# Patient Record
Sex: Female | Born: 1975 | Smoking: Never smoker
Health system: Southern US, Academic
[De-identification: ages and names within clinical notes are randomized; demographics above are authoritative.]

## PROBLEM LIST (undated history)

## (undated) DIAGNOSIS — D259 Leiomyoma of uterus, unspecified: Secondary | ICD-10-CM

## (undated) DIAGNOSIS — F99 Mental disorder, not otherwise specified: Secondary | ICD-10-CM

## (undated) DIAGNOSIS — N809 Endometriosis, unspecified: Secondary | ICD-10-CM

## (undated) HISTORY — PX: HX TUBAL LIGATION: SHX77

## (undated) HISTORY — PX: HX TONSILLECTOMY: SHX27

## (undated) HISTORY — DX: Leiomyoma of uterus, unspecified: D25.9

## (undated) HISTORY — PX: LAPAROSCOPIC ENDOMETRIOSIS FULGURATION: SUR769

## (undated) HISTORY — DX: Mental disorder, not otherwise specified: F99

## (undated) HISTORY — PX: TUBAL LIGATION: SHX77

## (undated) HISTORY — PX: TONSILLECTOMY: SUR1361

---

## 2005-01-09 ENCOUNTER — Emergency Department: Payer: Self-pay | Admitting: Unknown Physician Specialty

## 2006-01-25 ENCOUNTER — Ambulatory Visit: Payer: Self-pay | Admitting: Unknown Physician Specialty

## 2006-10-09 ENCOUNTER — Emergency Department: Payer: Self-pay | Admitting: Unknown Physician Specialty

## 2007-04-16 ENCOUNTER — Encounter: Payer: Self-pay | Admitting: Maternal and Fetal Medicine

## 2007-04-29 ENCOUNTER — Observation Stay: Payer: Self-pay | Admitting: Obstetrics and Gynecology

## 2007-05-12 ENCOUNTER — Observation Stay: Payer: Self-pay | Admitting: Obstetrics and Gynecology

## 2007-05-13 ENCOUNTER — Ambulatory Visit: Payer: Self-pay | Admitting: Obstetrics and Gynecology

## 2007-05-14 ENCOUNTER — Inpatient Hospital Stay: Payer: Self-pay | Admitting: Obstetrics and Gynecology

## 2008-06-08 ENCOUNTER — Other Ambulatory Visit: Payer: Self-pay

## 2008-06-08 ENCOUNTER — Ambulatory Visit: Payer: Self-pay | Admitting: Orthopedic Surgery

## 2008-06-08 ENCOUNTER — Ambulatory Visit: Payer: Self-pay | Admitting: Cardiology

## 2008-06-09 ENCOUNTER — Ambulatory Visit: Payer: Self-pay | Admitting: Internal Medicine

## 2008-06-14 ENCOUNTER — Ambulatory Visit: Payer: Self-pay | Admitting: Internal Medicine

## 2008-12-01 ENCOUNTER — Emergency Department: Payer: Self-pay | Admitting: Emergency Medicine

## 2008-12-03 ENCOUNTER — Emergency Department: Payer: Self-pay | Admitting: Emergency Medicine

## 2009-05-09 ENCOUNTER — Emergency Department: Payer: Self-pay | Admitting: Emergency Medicine

## 2009-05-11 ENCOUNTER — Emergency Department: Payer: Self-pay | Admitting: Emergency Medicine

## 2009-07-02 ENCOUNTER — Emergency Department: Payer: Self-pay | Admitting: Emergency Medicine

## 2009-08-28 ENCOUNTER — Emergency Department: Payer: Self-pay | Admitting: Emergency Medicine

## 2010-05-09 ENCOUNTER — Emergency Department (HOSPITAL_COMMUNITY): Admission: EM | Admit: 2010-05-09 | Discharge: 2010-05-09 | Payer: Self-pay | Admitting: Emergency Medicine

## 2010-09-27 ENCOUNTER — Emergency Department: Payer: Self-pay | Admitting: Emergency Medicine

## 2011-01-07 ENCOUNTER — Emergency Department: Payer: Self-pay | Admitting: Internal Medicine

## 2011-01-27 LAB — COMPREHENSIVE METABOLIC PANEL
Alkaline Phosphatase: 60 U/L (ref 39–117)
BUN: 11 mg/dL (ref 6–23)
CO2: 29 mEq/L (ref 19–32)
Calcium: 9.2 mg/dL (ref 8.4–10.5)
GFR calc non Af Amer: >60 mL/min (ref >60)
Glucose, Bld: 83 mg/dL (ref 70–99)
Potassium: 3.9 mEq/L (ref 3.5–5.1)
Total Protein: 7 g/dL (ref 6.0–8.3)

## 2011-01-27 LAB — URINE CULTURE

## 2011-01-27 LAB — DIFFERENTIAL
Basophils Relative: 0 % (ref 0–1)
Eosinophils Absolute: 0.1 10*3/uL (ref 0.0–0.7)
Monocytes Relative: 5 % (ref 3–12)
Neutro Abs: 4.7 10*3/uL (ref 1.7–7.7)
Neutrophils Relative %: 60 % (ref 43–77)

## 2011-01-27 LAB — CBC
HCT: 39.2 % (ref 36.0–46.0)
Hemoglobin: 13.5 g/dL (ref 12.0–15.0)
MCHC: 34.5 g/dL (ref 30.0–36.0)
MCV: 91.2 fL (ref 78.0–100.0)
RDW: 13.9 % (ref 11.5–15.5)

## 2011-01-27 LAB — URINALYSIS, ROUTINE W REFLEX MICROSCOPIC
Nitrite: NEGATIVE
Specific Gravity, Urine: 1.02 (ref 1.005–1.030)
Urobilinogen, UA: 1 mg/dL (ref 0.0–1.0)

## 2011-01-27 LAB — PREGNANCY, URINE: Preg Test, Ur: NEGATIVE

## 2011-03-12 ENCOUNTER — Emergency Department (HOSPITAL_COMMUNITY)
Admission: EM | Admit: 2011-03-12 | Discharge: 2011-03-12 | Disposition: A | Discharge status: Home / Self Care | Payer: Self-pay | Attending: Emergency Medicine | Admitting: Emergency Medicine

## 2011-03-12 DIAGNOSIS — W268XXA Contact with other sharp object(s), not elsewhere classified, initial encounter: Secondary | ICD-10-CM | POA: Insufficient documentation

## 2011-03-12 DIAGNOSIS — Y92009 Unspecified place in unspecified non-institutional (private) residence as the place of occurrence of the external cause: Secondary | ICD-10-CM | POA: Insufficient documentation

## 2011-03-12 DIAGNOSIS — S61209A Unspecified open wound of unspecified finger without damage to nail, initial encounter: Secondary | ICD-10-CM | POA: Insufficient documentation

## 2011-12-09 ENCOUNTER — Emergency Department: Payer: Self-pay | Admitting: Emergency Medicine

## 2014-02-22 ENCOUNTER — Emergency Department: Payer: Self-pay | Admitting: Emergency Medicine

## 2014-04-29 ENCOUNTER — Emergency Department (HOSPITAL_COMMUNITY): Payer: Self-pay

## 2014-04-29 ENCOUNTER — Emergency Department (HOSPITAL_COMMUNITY)
Admission: EM | Admit: 2014-04-29 | Discharge: 2014-04-29 | Disposition: A | Discharge status: Home / Self Care | Payer: Self-pay | Attending: Emergency Medicine | Admitting: Emergency Medicine

## 2014-04-29 ENCOUNTER — Encounter (HOSPITAL_COMMUNITY): Payer: Self-pay | Admitting: Emergency Medicine

## 2014-04-29 DIAGNOSIS — Z3202 Encounter for pregnancy test, result negative: Secondary | ICD-10-CM | POA: Insufficient documentation

## 2014-04-29 DIAGNOSIS — Z9851 Tubal ligation status: Secondary | ICD-10-CM | POA: Insufficient documentation

## 2014-04-29 DIAGNOSIS — Z8742 Personal history of other diseases of the female genital tract: Secondary | ICD-10-CM | POA: Insufficient documentation

## 2014-04-29 DIAGNOSIS — Z87448 Personal history of other diseases of urinary system: Secondary | ICD-10-CM | POA: Insufficient documentation

## 2014-04-29 DIAGNOSIS — R112 Nausea with vomiting, unspecified: Secondary | ICD-10-CM | POA: Insufficient documentation

## 2014-04-29 DIAGNOSIS — R1011 Right upper quadrant pain: Secondary | ICD-10-CM | POA: Insufficient documentation

## 2014-04-29 DIAGNOSIS — R000 Tachycardia, unspecified: Secondary | ICD-10-CM | POA: Insufficient documentation

## 2014-04-29 DIAGNOSIS — R1013 Epigastric pain: Secondary | ICD-10-CM | POA: Insufficient documentation

## 2014-04-29 DIAGNOSIS — R52 Pain, unspecified: Secondary | ICD-10-CM | POA: Insufficient documentation

## 2014-04-29 HISTORY — DX: Endometriosis, unspecified: N80.9

## 2014-04-29 LAB — COMPREHENSIVE METABOLIC PANEL
ALBUMIN: 3.9 g/dL (ref 3.5–5.2)
ALT: 16 U/L (ref 0–35)
AST: 16 U/L (ref 0–37)
Alkaline Phosphatase: 61 U/L (ref 39–117)
BUN: 13 mg/dL (ref 6–23)
CALCIUM: 9.1 mg/dL (ref 8.4–10.5)
CO2: 30 mEq/L (ref 19–32)
CREATININE: 0.89 mg/dL (ref 0.50–1.10)
Chloride: 102 mEq/L (ref 96–112)
GFR calc Af Amer: >90 mL/min (ref >90)
GFR calc non Af Amer: 81 mL/min — ABNORMAL LOW (ref >90)
Glucose, Bld: 79 mg/dL (ref 70–99)
Potassium: 3.8 mEq/L (ref 3.7–5.3)
Sodium: 143 mEq/L (ref 137–147)
Total Bilirubin: 0.4 mg/dL (ref 0.3–1.2)
Total Protein: 7 g/dL (ref 6.0–8.3)

## 2014-04-29 LAB — CBC WITH DIFFERENTIAL/PLATELET
BASOS ABS: 0 10*3/uL (ref 0.0–0.1)
BASOS PCT: 0 % (ref 0–1)
EOS PCT: 1 % (ref 0–5)
Eosinophils Absolute: 0.1 10*3/uL (ref 0.0–0.7)
HEMATOCRIT: 37.8 % (ref 36.0–46.0)
Hemoglobin: 13.1 g/dL (ref 12.0–15.0)
Lymphocytes Relative: 31 % (ref 12–46)
Lymphs Abs: 2.3 10*3/uL (ref 0.7–4.0)
MCH: 30.8 pg (ref 26.0–34.0)
MCHC: 34.7 g/dL (ref 30.0–36.0)
MCV: 88.7 fL (ref 78.0–100.0)
MONO ABS: 0.6 10*3/uL (ref 0.1–1.0)
Monocytes Relative: 8 % (ref 3–12)
Neutro Abs: 4.5 10*3/uL (ref 1.7–7.7)
Neutrophils Relative %: 60 % (ref 43–77)
Platelets: 250 10*3/uL (ref 150–400)
RBC: 4.26 MIL/uL (ref 3.87–5.11)
RDW: 12.6 % (ref 11.5–15.5)
WBC: 7.5 10*3/uL (ref 4.0–10.5)

## 2014-04-29 LAB — URINALYSIS, ROUTINE W REFLEX MICROSCOPIC
Bilirubin Urine: NEGATIVE
GLUCOSE, UA: NEGATIVE mg/dL
Hgb urine dipstick: NEGATIVE
Ketones, ur: NEGATIVE mg/dL
LEUKOCYTES UA: NEGATIVE
Nitrite: NEGATIVE
PH: 7 (ref 5.0–8.0)
PROTEIN: NEGATIVE mg/dL
Specific Gravity, Urine: 1.015 (ref 1.005–1.030)
Urobilinogen, UA: 0.2 mg/dL (ref 0.0–1.0)

## 2014-04-29 LAB — POC URINE PREG, ED: PREG TEST UR: NEGATIVE

## 2014-04-29 LAB — LIPASE, BLOOD: Lipase: 23 U/L (ref 11–59)

## 2014-04-29 MED ORDER — HYDROMORPHONE HCL PF 1 MG/ML IJ SOLN
1.0000 mg | Freq: Once | INTRAMUSCULAR | Status: DC
  Filled 2014-04-29: qty 1

## 2014-04-29 MED ORDER — SODIUM CHLORIDE 0.9 % IV SOLN
INTRAVENOUS | Status: DC
  Administered 2014-04-29: 1000 mL via INTRAVENOUS

## 2014-04-29 MED ORDER — ONDANSETRON HCL 4 MG/2ML IJ SOLN
4.0000 mg | Freq: Once | INTRAMUSCULAR | Status: DC
  Filled 2014-04-29: qty 2

## 2014-04-29 MED ORDER — PROMETHAZINE HCL 25 MG/ML IJ SOLN
12.5000 mg | Freq: Once | INTRAMUSCULAR | Status: AC
  Administered 2014-04-29: 12.5 mg via INTRAVENOUS
  Filled 2014-04-29: qty 1

## 2014-04-29 MED ORDER — FAMOTIDINE 20 MG PO TABS: 20.0000 mg | ORAL_TABLET | Freq: Two times a day (BID) | ORAL | Status: DC

## 2014-04-29 MED ORDER — HYDROCODONE-ACETAMINOPHEN 5-325 MG PO TABS: 1.0000 | ORAL_TABLET | ORAL | Status: DC | PRN

## 2014-04-29 MED ORDER — HYDROMORPHONE HCL PF 1 MG/ML IJ SOLN
1.0000 mg | Freq: Once | INTRAMUSCULAR | Status: AC
  Administered 2014-04-29: 1 mg via INTRAVENOUS

## 2014-04-29 MED ORDER — HYDROMORPHONE HCL PF 1 MG/ML IJ SOLN
1.0000 mg | Freq: Once | INTRAMUSCULAR | Status: AC
  Administered 2014-04-29: 1 mg via INTRAVENOUS
  Filled 2014-04-29: qty 1

## 2014-04-29 MED ORDER — ONDANSETRON HCL 4 MG/2ML IJ SOLN
4.0000 mg | Freq: Once | INTRAMUSCULAR | Status: DC
Start: 2014-04-29 — End: 2014-04-29
  Filled 2014-04-29: qty 2

## 2014-04-29 MED ORDER — PROMETHAZINE HCL 25 MG PO TABS: 25.0000 mg | ORAL_TABLET | Freq: Four times a day (QID) | ORAL | Status: DC | PRN

## 2014-04-29 MED ORDER — FAMOTIDINE IN NACL 20-0.9 MG/50ML-% IV SOLN
20.0000 mg | Freq: Once | INTRAVENOUS | Status: AC
  Administered 2014-04-29: 20 mg via INTRAVENOUS
  Filled 2014-04-29: qty 50

## 2014-04-29 MED ORDER — ONDANSETRON HCL 4 MG/2ML IJ SOLN
4.0000 mg | Freq: Once | INTRAMUSCULAR | Status: AC
  Administered 2014-04-29: 4 mg via INTRAVENOUS

## 2014-04-29 NOTE — Discharge Instructions (Signed)
Your ultrasound today shows that you have sludge in your gall bladder. This may be causing the pain you are having in the upper right side of your abdomen. We are treating you with pain medication, medication for nausea and a gastric acid blocker. However, you will need to follow up with the surgeon. Make sure your diet is low in fat and spicy foods to try and prevent more attacks.

## 2014-04-29 NOTE — ED Provider Notes (Signed)
CSN: [id number]     Arrival date & time 04/29/14  1136 History   First MD Initiated Contact with Patient 04/29/14 1147     Chief Complaint  Patient presents with  . Abdominal Pain     (Consider location/radiation/quality/duration/timing/severity/associated sxs/prior Treatment) Patient is a 37 y.o. female presenting with abdominal pain. The history is provided by the patient.  Abdominal Pain Pain location:  RUQ Pain quality: bloating, burning, fullness and squeezing   Pain radiates to:  Does not radiate Pain severity:  Severe Progression:  Worsening Relieved by:  Nothing Worsened by:  Eating Ineffective treatments:  None tried Associated symptoms: nausea and vomiting    Summer Bates is a 38 y.o. female who presents to the ED with epigastric and RUQ abdominal pain with nausea and vomiting that is severe today. She states that over the past several months she has had similar episodes but they usually resolve. This time the pain and n/v has continues.   Past Medical History  Diagnosis Date  . Endometriosis    Past Surgical History  Procedure Laterality Date  . Tonsillectomy    . Tubal ligation     No family history on file. History  Substance Use Topics  . Smoking status: Never Smoker   . Smokeless tobacco: Not on file  . Alcohol Use: No   OB History   Grav Para Term Preterm Abortions TAB SAB Ect Mult Living                 Review of Systems  Gastrointestinal: Positive for nausea, vomiting and abdominal pain.  All other systems negative    Allergies  Review of patient's allergies indicates no known allergies.  Home Medications   Prior to Admission medications   Not on File   BP 129/80  Pulse 113  Temp(Src) 97.9 F (36.6 C) (Oral)  Resp 20  Ht 5\' 5"  (1.651 m)  Wt 180 lb (81.647 kg)  BMI 29.95 kg/m2  SpO2 98%  LMP 04/20/2014 Physical Exam  Nursing note and vitals reviewed. Constitutional: She is oriented to person, place, and time. She appears  well-developed and well-nourished. No distress.  HENT:  Head: Normocephalic.  Eyes: EOM are normal.  Neck: Neck supple.  Cardiovascular: Tachycardia present.   Pulmonary/Chest: Effort normal.  Abdominal: Soft. Bowel sounds are normal. There is tenderness in the right upper quadrant and epigastric area. There is no rigidity, no rebound and no CVA tenderness.  Musculoskeletal: Normal range of motion.  Neurological: She is alert and oriented to person, place, and time. No cranial nerve deficit.  Skin: Skin is warm and dry.  Psychiatric: She has a normal mood and affect. Her behavior is normal.    ED Course: Dr. Lacinda Axon in to examine the patient and discuss ultrasound results.  Procedures  IV NS, Zofran, Dilaudid 1 mg IV, Pepcid 20 mg IV, Phenergan 12.5 mg IV, Dilaudid 1 mg IV repeated  Labs, [place] Results for orders placed during the hospital encounter of 04/29/14 (from the past 24 hour(s))  CBC WITH DIFFERENTIAL     Status: None   Collection Time    04/29/14 12:09 PM      Result Value Ref Range   WBC 7.5  4.0 - 10.5 K/uL   RBC 4.26  3.87 - 5.11 MIL/uL   Hemoglobin 13.1  12.0 - 15.0 g/dL   HCT 37.8  36.0 - 46.0 %   MCV 88.7  78.0 - 100.0 fL   MCH 30.8  26.0 - 34.0  pg   MCHC 34.7  30.0 - 36.0 g/dL   RDW 12.6  11.5 - 15.5 %   Platelets 250  150 - 400 K/uL   Neutrophils Relative % 60  43 - 77 %   Neutro Abs 4.5  1.7 - 7.7 K/uL   Lymphocytes Relative 31  12 - 46 %   Lymphs Abs 2.3  0.7 - 4.0 K/uL   Monocytes Relative 8  3 - 12 %   Monocytes Absolute 0.6  0.1 - 1.0 K/uL   Eosinophils Relative 1  0 - 5 %   Eosinophils Absolute 0.1  0.0 - 0.7 K/uL   Basophils Relative 0  0 - 1 %   Basophils Absolute 0.0  0.0 - 0.1 K/uL  COMPREHENSIVE METABOLIC PANEL     Status: Abnormal   Collection Time    04/29/14 12:09 PM      Result Value Ref Range   Sodium 143  137 - 147 mEq/L   Potassium 3.8  3.7 - 5.3 mEq/L   Chloride 102  96 - 112 mEq/L   CO2 30  19 - 32 mEq/L   Glucose, Bld 79  70 - 99  mg/dL   BUN 13  6 - 23 mg/dL   Creatinine, Ser 0.89  0.50 - 1.10 mg/dL   Calcium 9.1  8.4 - 10.5 mg/dL   Total Protein 7.0  6.0 - 8.3 g/dL   Albumin 3.9  3.5 - 5.2 g/dL   AST 16  0 - 37 U/L   ALT 16  0 - 35 U/L   Alkaline Phosphatase 61  39 - 117 U/L   Total Bilirubin 0.4  0.3 - 1.2 mg/dL   GFR calc non Af Amer 81 (*) >90 mL/min   GFR calc Af Amer >90  >90 mL/min  LIPASE, BLOOD     Status: None   Collection Time    04/29/14 12:09 PM      Result Value Ref Range   Lipase 23  11 - 59 U/L     Imaging Review US Abdomen Limited  04/29/2014   CLINICAL DATA:  RUQ pain, n/v  EXAM: US ABDOMEN LIMITED - RIGHT UPPER QUADRANT  COMPARISON:  None.  FINDINGS: Gallbladder:  No gallstones or wall thickening visualized, measuring 2.6 mm. Mildly echogenic sludge within the dependent portion gallbladder. Positive sonographic Murphy's sign. No pericholecystic fluid.  Common bile duct:  Diameter: 3.8 mm  Liver:  No focal lesion identified. Within normal limits in parenchymal echogenicity.  IMPRESSION: Positive sonographic Murphy's sign. No further sonographic evidence of cholecystitis. Small amount of dependent sludge within the gallbladder. Clinical correlation recommended.   Electronically Signed   By: Margaree Mackintosh M.D.   On: 04/29/2014 13:46   MDM  38 y.o. female with epigastric and RUQ pain, n/v. Stable for discharge with gall stones or blockage of the bile duct. VS stable BP 120/70  Pulse 85  Temp(Src) 97.9 F (36.6 C) (Oral)  Resp 20  Ht 5\' 5"  (1.651 m)  Wt 180 lb (81.647 kg)  BMI 29.95 kg/m2  SpO2 98%  LMP 04/20/2014  I have reviewed this patient's vital signs, nurses notes, appropriate labs and imaging.  I have discussed finding with the patient and plan of care. She voices understanding. She will follow up with the general surgeon.    Medication List         famotidine 20 MG tablet  Commonly known as:  PEPCID  Take 1 tablet (20 mg total) by  mouth 2 (two) times daily.      HYDROcodone-acetaminophen 5-325 MG per tablet  Commonly known as:  NORCO/VICODIN  Take 1 tablet by mouth every 4 (four) hours as needed.     promethazine 25 MG tablet  Commonly known as:  PHENERGAN  Take 1 tablet (25 mg total) by mouth every 6 (six) hours as needed for nausea or vomiting.           [place], [place] 04/29/14 1704

## 2014-04-29 NOTE — ED Notes (Signed)
Pt reports frequent episodes of RUQ pain, pain in right side, and vomiting.

## 2014-04-29 NOTE — ED Notes (Signed)
Patient discharged but does not have ride home. Patient to waiting for med time for narcotic or until able to get ride home. NP and charge nurse aware.

## 2014-04-30 NOTE — ED Provider Notes (Signed)
Medical screening examination/treatment/procedure(s) were conducted as a shared visit with non-physician practitioner(s) and myself.  I personally evaluated the patient during the encounter.   EKG Interpretation None     Right upper quadrant pain with radiation to the back. Right upper quadrant ultrasound reveals gallbladder sludge but no frank stones. Referral to general surgery   Nat Christen, MD 04/30/14 [phone]

## 2014-05-03 ENCOUNTER — Emergency Department (HOSPITAL_COMMUNITY)
Admission: EM | Admit: 2014-05-03 | Discharge: 2014-05-03 | Disposition: A | Discharge status: Home / Self Care | Payer: Self-pay | Attending: Emergency Medicine | Admitting: Emergency Medicine

## 2014-05-03 ENCOUNTER — Encounter (HOSPITAL_COMMUNITY): Payer: Self-pay | Admitting: Emergency Medicine

## 2014-05-03 DIAGNOSIS — K8020 Calculus of gallbladder without cholecystitis without obstruction: Secondary | ICD-10-CM | POA: Insufficient documentation

## 2014-05-03 DIAGNOSIS — K8050 Calculus of bile duct without cholangitis or cholecystitis without obstruction: Secondary | ICD-10-CM

## 2014-05-03 DIAGNOSIS — Z9851 Tubal ligation status: Secondary | ICD-10-CM | POA: Insufficient documentation

## 2014-05-03 DIAGNOSIS — Z79899 Other long term (current) drug therapy: Secondary | ICD-10-CM | POA: Insufficient documentation

## 2014-05-03 DIAGNOSIS — Z8742 Personal history of other diseases of the female genital tract: Secondary | ICD-10-CM | POA: Insufficient documentation

## 2014-05-03 DIAGNOSIS — R630 Anorexia: Secondary | ICD-10-CM | POA: Insufficient documentation

## 2014-05-03 LAB — CBC WITH DIFFERENTIAL/PLATELET
Basophils Absolute: 0 10*3/uL (ref 0.0–0.1)
Basophils Relative: 0 % (ref 0–1)
Eosinophils Absolute: 0 10*3/uL (ref 0.0–0.7)
Eosinophils Relative: 0 % (ref 0–5)
HCT: 40.7 % (ref 36.0–46.0)
Hemoglobin: 14.2 g/dL (ref 12.0–15.0)
Lymphocytes Relative: 16 % (ref 12–46)
Lymphs Abs: 1.7 10*3/uL (ref 0.7–4.0)
MCH: 30.9 pg (ref 26.0–34.0)
MCHC: 34.9 g/dL (ref 30.0–36.0)
MCV: 88.7 fL (ref 78.0–100.0)
Monocytes Absolute: 0.5 10*3/uL (ref 0.1–1.0)
Monocytes Relative: 5 % (ref 3–12)
Neutro Abs: 8.3 10*3/uL — ABNORMAL HIGH (ref 1.7–7.7)
Neutrophils Relative %: 79 % — ABNORMAL HIGH (ref 43–77)
Platelets: 271 10*3/uL (ref 150–400)
RBC: 4.59 MIL/uL (ref 3.87–5.11)
RDW: 12.7 % (ref 11.5–15.5)
WBC: 10.5 10*3/uL (ref 4.0–10.5)

## 2014-05-03 LAB — COMPREHENSIVE METABOLIC PANEL WITH GFR
ALT: 22 U/L (ref 0–35)
AST: 20 U/L (ref 0–37)
Albumin: 4.1 g/dL (ref 3.5–5.2)
Alkaline Phosphatase: 79 U/L (ref 39–117)
BUN: 10 mg/dL (ref 6–23)
CO2: 25 meq/L (ref 19–32)
Calcium: 9.7 mg/dL (ref 8.4–10.5)
Chloride: 104 meq/L (ref 96–112)
Creatinine, Ser: 0.67 mg/dL (ref 0.50–1.10)
GFR calc Af Amer: >90 mL/min
GFR calc non Af Amer: >90 mL/min
Glucose, Bld: 97 mg/dL (ref 70–99)
Potassium: 4.3 meq/L (ref 3.7–5.3)
Sodium: 143 meq/L (ref 137–147)
Total Bilirubin: 0.2 mg/dL — ABNORMAL LOW (ref 0.3–1.2)
Total Protein: 8 g/dL (ref 6.0–8.3)

## 2014-05-03 LAB — LIPASE, BLOOD: Lipase: 20 U/L (ref 11–59)

## 2014-05-03 MED ORDER — HYDROMORPHONE HCL PF 1 MG/ML IJ SOLN
1.0000 mg | Freq: Once | INTRAMUSCULAR | Status: AC
  Administered 2014-05-03: 1 mg via INTRAVENOUS
  Filled 2014-05-03: qty 1

## 2014-05-03 MED ORDER — OXYCODONE-ACETAMINOPHEN 5-325 MG PO TABS: 1.0000 | ORAL_TABLET | ORAL | Status: DC | PRN

## 2014-05-03 MED ORDER — MORPHINE SULFATE 4 MG/ML IJ SOLN
4.0000 mg | Freq: Once | INTRAMUSCULAR | Status: AC
  Administered 2014-05-03: 4 mg via INTRAVENOUS
  Filled 2014-05-03: qty 1

## 2014-05-03 MED ORDER — LORAZEPAM 2 MG/ML IJ SOLN
1.0000 mg | Freq: Once | INTRAMUSCULAR | Status: AC
  Administered 2014-05-03: 1 mg via INTRAVENOUS
  Filled 2014-05-03: qty 1

## 2014-05-03 MED ORDER — ONDANSETRON HCL 4 MG/2ML IJ SOLN
4.0000 mg | Freq: Once | INTRAMUSCULAR | Status: AC
  Administered 2014-05-03: 4 mg via INTRAVENOUS
  Filled 2014-05-03: qty 2

## 2014-05-03 NOTE — Discharge Instructions (Signed)
Biliary Colic  °Biliary colic is a steady or irregular pain in the upper abdomen. It is usually under the right side of the rib cage. It happens when gallstones interfere with the normal flow of bile from the gallbladder. Bile is a liquid that helps to digest fats. Bile is made in the liver and stored in the gallbladder. When you eat a meal, bile passes from the gallbladder through the cystic duct and the common bile duct into the small intestine. There, it mixes with partially digested food. If a gallstone blocks either of these ducts, the normal flow of bile is blocked. The muscle cells in the bile duct contract forcefully to try to move the stone. This causes the pain of biliary colic.  °SYMPTOMS  °· A person with biliary colic usually complains of pain in the upper abdomen. This pain can be: °¨ In the center of the upper abdomen just below the breastbone. °¨ In the upper-right part of the abdomen, near the gallbladder and liver. °¨ Spread back toward the right shoulder blade. °· Nausea and vomiting. °· The pain usually occurs after eating. °· Biliary colic is usually triggered by the digestive system's demand for bile. The demand for bile is high after fatty meals. Symptoms can also occur when a person who has been fasting suddenly eats a very large meal. Most episodes of biliary colic pass after 1 to 5 hours. After the most intense pain passes, your abdomen may continue to ache mildly for about 24 hours. °DIAGNOSIS  °After you describe your symptoms, your caregiver will perform a physical exam. He or she will pay attention to the upper right portion of your belly (abdomen). This is the area of your liver and gallbladder. An ultrasound will help your caregiver look for gallstones. Specialized scans of the gallbladder may also be done. Blood tests may be done, especially if you have fever or if your pain persists. °PREVENTION  °Biliary colic can be prevented by controlling the risk factors for gallstones. Some of  these risk factors, such as heredity, increasing age, and pregnancy are a normal part of life. Obesity and a high-fat diet are risk factors you can change through a healthy lifestyle. Women going through menopause who take hormone replacement therapy (estrogen) are also more likely to develop biliary colic. °TREATMENT  °· Pain medication may be prescribed. °· You may be encouraged to eat a fat-free diet. °· If the first episode of biliary colic is severe, or episodes of colic keep retuning, surgery to remove the gallbladder (cholecystectomy) is usually recommended. This procedure can be done through small incisions using an instrument called a laparoscope. The procedure often requires a brief stay in the hospital. Some people can leave the hospital the same day. It is the most widely used treatment in people troubled by painful gallstones. It is effective and safe, with no complications in more than 90% of cases. °· If surgery cannot be done, medication that dissolves gallstones may be used. This medication is expensive and can take months or years to work. Only small stones will dissolve. °· Rarely, medication to dissolve gallstones is combined with a procedure called shock-wave lithotripsy. This procedure uses carefully aimed shock waves to break up gallstones. In many people treated with this procedure, gallstones form again within a few years. °PROGNOSIS  °If gallstones block your cystic duct or common bile duct, you are at risk for repeated episodes of biliary colic. There is also a 25% chance that you will develop   a gallbladder infection(acute cholecystitis), or some other complication of gallstones within 10 to 20 years. If you have surgery, schedule it at a time that is convenient for you and at a time when you are not sick. HOME CARE INSTRUCTIONS   Drink plenty of clear fluids.  Avoid fatty, greasy or fried foods, or any foods that make your pain worse.  Take medications as directed. SEEK MEDICAL  CARE IF:   You develop a fever over 100.5 F (38.1 C).  Your pain gets worse over time.  You develop nausea that prevents you from eating and drinking.  You develop vomiting. SEEK IMMEDIATE MEDICAL CARE IF:   You have continuous or severe belly (abdominal) pain which is not relieved with medications.  You develop nausea and vomiting which is not relieved with medications.  You have symptoms of biliary colic and you suddenly develop a fever and shaking chills. This may signal cholecystitis. Call your caregiver immediately.  You develop a yellow color to your skin or the white part of your eyes (jaundice). Document Released: 03/31/2006 Document Revised: 01/20/2012 Document Reviewed: 06/09/2008 [facility] Patient Information 2015 [place], [place]. This information is not intended to replace advice given to you by your health care provider. Make sure you discuss any questions you have with your health care provider.    As discussed call Dr. Arnoldo Morale office for an office appointment next Thursday.  Return here or call him for an appointment sooner for any worsened including uncontrolled vomiting, abdominal pain or fever develop fever.  You may use the oxycodone for pain control in place of your hydrocodone.  Use caution as this medication will make you sleepy.  Continue taking your Phenergan if needed for nausea or vomiting.  Also, as discussed, you need to contact the financial office here at [facility] to discuss your upcoming elective surgery so they can arrange for financial assistance for this.  Call the [facility] office for this - [phone] and asked for the financial office to get this taken care of.

## 2014-05-03 NOTE — ED Provider Notes (Signed)
CSN: [id number]     Arrival date & time 05/03/14  1110 History   First MD Initiated Contact with Patient 05/03/14 1136     Chief Complaint  Patient presents with  . Abdominal Pain     (Consider location/radiation/quality/duration/timing/severity/associated sxs/prior Treatment) HPI Comments: Summer Bates is a 38 y.o. Female presenting with return of her recently frequent right upper quadrant abdominal pain, nausea and vomiting which started again early this morning.  She was seen here for this 4 days ago at which time an abdominal US revealed gallbladder sludge without stones and no acute cholecystitis.  She was prescribed hydrocodone and phenergan which is not controlling her symptoms.  She denies fevers or chills, weakness or dizziness.  She endorses nearly a 10 pound weight loss since this started as she has a reduced appetite. Additionally denies diarrhea, constipation, lower abdominal pain, dysuria and vaginal discharge.   She expresses frustration over her frequent episodes of pain, stating she is having episodes every 2-3 days and is concerned she will lose her job due to absences.       The history is provided by the patient.    Past Medical History  Diagnosis Date  . Endometriosis    Past Surgical History  Procedure Laterality Date  . Tonsillectomy    . Tubal ligation     History reviewed. No pertinent family history. History  Substance Use Topics  . Smoking status: Never Smoker   . Smokeless tobacco: Not on file  . Alcohol Use: Yes   OB History   Grav Para Term Preterm Abortions TAB SAB Ect Mult Living                 Review of Systems  Constitutional: Positive for appetite change.  HENT: Negative for congestion.   Eyes: Negative.   Respiratory: Negative for chest tightness.   Gastrointestinal: Positive for nausea, vomiting and abdominal pain. Negative for diarrhea, constipation and abdominal distention.  Genitourinary: Negative.  Negative for dysuria.   Musculoskeletal: Negative for arthralgias, joint swelling and neck pain.  Skin: Negative.  Negative for rash and wound.  Neurological: Negative for dizziness, weakness, light-headedness, numbness and headaches.  Psychiatric/Behavioral: Negative.       Allergies  Review of patient's allergies indicates no known allergies.  Home Medications   Prior to Admission medications   Medication Sig Start Date End Date Taking? Authorizing Provider  famotidine (PEPCID) 20 MG tablet Take 1 tablet (20 mg total) by mouth 2 (two) times daily. 04/29/14  Yes [place], NP  HYDROcodone-acetaminophen (NORCO/VICODIN) 5-325 MG per tablet Take 1 tablet by mouth every 4 (four) hours as needed. 04/29/14  Yes Hope Bunnie Pion, NP  promethazine (PHENERGAN) 25 MG tablet Take 1 tablet (25 mg total) by mouth every 6 (six) hours as needed for nausea or vomiting. 04/29/14  Yes Hope Bunnie Pion, NP  oxyCODONE-acetaminophen (PERCOCET/ROXICET) 5-325 MG per tablet Take 1 tablet by mouth every 4 (four) hours as needed for severe pain. 05/03/14   Evalee Jefferson, PA-C   BP 109/82  Pulse 92  Temp(Src) 97.9 F (36.6 C) (Oral)  Resp 18  Ht 5\' 5"  (1.651 m)  Wt 180 lb (81.647 kg)  BMI 29.95 kg/m2  SpO2 94%  LMP 04/20/2014 Physical Exam  Nursing note and vitals reviewed. Constitutional: She appears well-developed and well-nourished.  HENT:  Head: Normocephalic and atraumatic.  Eyes: Conjunctivae are normal. No scleral icterus.  Neck: Normal range of motion.  Cardiovascular: Normal rate, regular rhythm, normal heart  sounds and intact distal pulses.   Pulmonary/Chest: Effort normal and breath sounds normal. She has no wheezes.  Abdominal: Soft. Bowel sounds are normal. There is no hepatosplenomegaly. There is tenderness in the right upper quadrant. There is positive Murphy's sign. There is no rebound, no guarding and no CVA tenderness.  Musculoskeletal: Normal range of motion.  Neurological: She is alert.  Skin: Skin is warm and  dry.  Psychiatric: She has a normal mood and affect.    ED Course  Procedures (including critical care time) Labs Review Labs Reviewed  CBC WITH DIFFERENTIAL - Abnormal; Notable for the following:    Neutrophils Relative % 79 (*)    Neutro Abs 8.3 (*)    All other components within normal limits  COMPREHENSIVE METABOLIC PANEL - Abnormal; Notable for the following:    Total Bilirubin 0.2 (*)    All other components within normal limits  LIPASE, BLOOD  URINALYSIS, ROUTINE W REFLEX MICROSCOPIC  POC URINE PREG, ED    Imaging Review No results found.   EKG Interpretation None      MDM   Final diagnoses:  Biliary colic    Patients labs and/or radiological studies were viewed and considered during the medical decision making and disposition process.  UA and urine preg negative 4 days ago, not repeated today. Pt with stable labs today, with abnormal [place] from 4 days ago revealing gb sludge.  She was given dilaudid 1 mg IV x 2 with transient relief of pain. She had no nausea or vomiting after receiving zofran.  She was then given morphine 4 mg along with ativan 1 mg IV with much improvement in pain sx.    Discussed case with Dr Arnoldo Morale who agrees with need for elective surgery.  She was given referral to him,  He can see her in 9 days (one week from this Thursday).  However, if pain not well controlled at home,  He may be able to get seen in 2 days. Advised she will need to talk to financial office here at [facility] before they can schedule her surgery.  She was prescribed percocet for home use, already has phenergan at home.  Advised return here or call Dr. Arnoldo Morale asap for worsened pain, uncontrolled vomiting or fever.    Evalee Jefferson, PA-C 05/03/14 [place], PA-C 05/03/14 [phone]

## 2014-05-03 NOTE — ED Notes (Signed)
Pt reports recurrence of right upper abdominal pain and nausea/vomiting. "It's my gall-bladder."

## 2014-05-03 NOTE — ED Notes (Signed)
Pt given morphine 4 mg IVP.  Informed that if driving she will need to wait 4-6 hour from last dose.  Pt aware and understand and attempting to call to pick her up.

## 2014-05-04 NOTE — ED Provider Notes (Signed)
Medical screening examination/treatment/procedure(s) were performed by non-physician practitioner and as supervising physician I was immediately available for consultation/collaboration.   EKG Interpretation None        Maudry Diego, MD 05/04/14 [phone]

## 2014-05-09 ENCOUNTER — Other Ambulatory Visit (HOSPITAL_COMMUNITY): Payer: Self-pay | Admitting: General Surgery

## 2014-05-09 DIAGNOSIS — K838 Other specified diseases of biliary tract: Secondary | ICD-10-CM

## 2014-05-09 DIAGNOSIS — R110 Nausea: Secondary | ICD-10-CM

## 2014-05-09 DIAGNOSIS — R1011 Right upper quadrant pain: Secondary | ICD-10-CM

## 2014-05-11 ENCOUNTER — Encounter (HOSPITAL_COMMUNITY): Payer: Self-pay | Admitting: Pharmacy Technician

## 2014-05-11 ENCOUNTER — Ambulatory Visit (HOSPITAL_COMMUNITY)
Admission: RE | Admit: 2014-05-11 | Discharge: 2014-05-11 | Disposition: A | Discharge status: Home / Self Care | Payer: Self-pay | Source: Ambulatory Visit | Attending: General Surgery | Admitting: General Surgery

## 2014-05-11 ENCOUNTER — Encounter (HOSPITAL_COMMUNITY): Payer: Self-pay

## 2014-05-11 DIAGNOSIS — K838 Other specified diseases of biliary tract: Secondary | ICD-10-CM

## 2014-05-11 DIAGNOSIS — R1011 Right upper quadrant pain: Secondary | ICD-10-CM

## 2014-05-11 DIAGNOSIS — R110 Nausea: Secondary | ICD-10-CM

## 2014-05-11 MED ORDER — TECHNETIUM TC 99M MEBROFENIN IV KIT
5.0000 | PACK | Freq: Once | INTRAVENOUS | Status: AC | PRN
  Administered 2014-05-11: 5 via INTRAVENOUS

## 2014-05-11 MED ORDER — STERILE WATER FOR INJECTION IJ SOLN
INTRAMUSCULAR | Status: AC
  Administered 2014-05-11: 5 mL
  Filled 2014-05-11: qty 10

## 2014-05-11 MED ORDER — SINCALIDE 5 MCG IJ SOLR
INTRAMUSCULAR | Status: AC
  Administered 2014-05-11: 1.68 ug
  Filled 2014-05-11: qty 5

## 2014-05-14 ENCOUNTER — Emergency Department (HOSPITAL_COMMUNITY)
Admission: EM | Admit: 2014-05-14 | Discharge: 2014-05-14 | Disposition: A | Discharge status: Home / Self Care | Payer: Self-pay | Attending: Emergency Medicine | Admitting: Emergency Medicine

## 2014-05-14 ENCOUNTER — Encounter (HOSPITAL_COMMUNITY): Payer: Self-pay | Admitting: Emergency Medicine

## 2014-05-14 DIAGNOSIS — N809 Endometriosis, unspecified: Secondary | ICD-10-CM | POA: Insufficient documentation

## 2014-05-14 DIAGNOSIS — R1011 Right upper quadrant pain: Secondary | ICD-10-CM | POA: Insufficient documentation

## 2014-05-14 DIAGNOSIS — Z79899 Other long term (current) drug therapy: Secondary | ICD-10-CM | POA: Insufficient documentation

## 2014-05-14 DIAGNOSIS — L03319 Cellulitis of trunk, unspecified: Secondary | ICD-10-CM

## 2014-05-14 DIAGNOSIS — IMO0002 Reserved for concepts with insufficient information to code with codable children: Secondary | ICD-10-CM

## 2014-05-14 DIAGNOSIS — L02219 Cutaneous abscess of trunk, unspecified: Secondary | ICD-10-CM | POA: Insufficient documentation

## 2014-05-14 MED ORDER — OXYCODONE-ACETAMINOPHEN 5-325 MG PO TABS: 1.0000 | ORAL_TABLET | ORAL | Status: DC | PRN

## 2014-05-14 MED ORDER — ONDANSETRON 4 MG PO TBDP
4.0000 mg | ORAL_TABLET | Freq: Once | ORAL | Status: AC
  Administered 2014-05-14: 4 mg via ORAL
  Filled 2014-05-14: qty 1

## 2014-05-14 MED ORDER — HYDROMORPHONE HCL PF 1 MG/ML IJ SOLN
1.0000 mg | Freq: Once | INTRAMUSCULAR | Status: AC
  Administered 2014-05-14: 1 mg via INTRAMUSCULAR
  Filled 2014-05-14: qty 1

## 2014-05-14 MED ORDER — ONDANSETRON HCL 4 MG PO TABS: 4.0000 mg | ORAL_TABLET | Freq: Four times a day (QID) | ORAL | Status: DC

## 2014-05-14 NOTE — ED Notes (Signed)
Per edp, pt has 2 antibiotics at home that she needs to continue for abscess. Pt aware. tp aware could not drive until 1173

## 2014-05-14 NOTE — ED Provider Notes (Signed)
CSN: [id number]     Arrival date & time 05/14/14  8938 History  This chart was scribed for Summer Manifold, MD by [place] Peace, ED Scribe. The patient was seen in [place]. The patient's care was started at 7:39 AM.     Chief Complaint  Patient presents with  . Abdominal Pain      Patient is a 38 y.o. female presenting with abdominal pain. The history is provided by the patient. No language interpreter was used.  Abdominal Pain Associated symptoms: nausea and vomiting   Associated symptoms: no chills, no dysuria, no fever and no hematuria    HPI Comments: Summer Bates is a 38 y.o. female who presents to the Emergency Department complaining of severe abdominal pain onset past few weeks with associated nausea and vomiting. Pt states that she is scheduled to have cholecystectomy on Tuesday due to reoccurring issues with gallbladder since November of last year. Pt also complains of abscess to her LUQ under her left breast but denies any form of drainage. She further denies any fever, chills, or urinary problems.    Past Medical History  Diagnosis Date  . Endometriosis    Past Surgical History  Procedure Laterality Date  . Tonsillectomy    . Tubal ligation     No family history on file. History  Substance Use Topics  . Smoking status: Never Smoker   . Smokeless tobacco: Not on file  . Alcohol Use: Yes   OB History   Grav Para Term Preterm Abortions TAB SAB Ect Mult Living                 Review of Systems  Constitutional: Negative for fever and chills.  Gastrointestinal: Positive for nausea, vomiting and abdominal pain.  Genitourinary: Negative for dysuria, hematuria and difficulty urinating.  All other systems reviewed and are negative.     Allergies  Review of patient's allergies indicates no known allergies.  Home Medications   Prior to Admission medications   Medication Sig Start Date End Date Taking? Authorizing Provider  famotidine (PEPCID) 20 MG tablet  Take 1 tablet (20 mg total) by mouth 2 (two) times daily. 04/29/14  Yes [place], NP  HYDROcodone-acetaminophen (NORCO/VICODIN) 5-325 MG per tablet Take 1 tablet by mouth every 4 (four) hours as needed. 04/29/14  Yes [place], NP  oxyCODONE-acetaminophen (PERCOCET/ROXICET) 5-325 MG per tablet Take 1 tablet by mouth every 4 (four) hours as needed for severe pain. 05/03/14  Yes Evalee Jefferson, PA-C  promethazine (PHENERGAN) 25 MG tablet Take 1 tablet (25 mg total) by mouth every 6 (six) hours as needed for nausea or vomiting. 04/29/14  Yes Hope Bunnie Pion, NP   Triage Vitals: BP 128/70  Pulse 85  Temp(Src) 97.7 F (36.5 C) (Oral)  Resp 20  Ht 5\' 5"  (1.651 m)  Wt 180 lb (81.647 kg)  BMI 29.95 kg/m2  SpO2 100%  LMP 04/20/2014 Physical Exam  Nursing note and vitals reviewed. Constitutional: She is oriented to person, place, and time. She appears well-developed and well-nourished. No distress.  HENT:  Head: Normocephalic and atraumatic.  Eyes: Conjunctivae and EOM are normal.  Neck: Neck supple. No tracheal deviation present.  Cardiovascular: Normal rate.   Pulmonary/Chest: Effort normal. No respiratory distress.  Abdominal: There is no rebound and no guarding.  Tenderness to palpation over gallbladder area.   Musculoskeletal: Normal range of motion.  Neurological: She is alert and oriented to person, place, and time.  Skin: Skin is warm  and dry.  Tender erythematous lesion under L breast. No drainage.   Psychiatric: She has a normal mood and affect. Her behavior is normal.     ED Course  Procedures (including critical care time) DIAGNOSTIC STUDIES: Oxygen Saturation is 100% on room air, normal by my interpretation.    COORDINATION OF CARE: 7:44 AM- Treatment plan was discussed with patient who verbalizes understanding and agrees.     Labs Review Labs Reviewed - No data to display  Imaging Review No results found.   EKG Interpretation None     Medications  HYDROmorphone  (DILAUDID) injection 1 mg (1 mg Intramuscular Given 05/14/14 0801)  ondansetron (ZOFRAN-ODT) disintegrating tablet 4 mg (4 mg Oral Given 05/14/14 0802)    MDM   Final diagnoses:  Right upper quadrant pain  Abscess or cellulitis of chest wall    38yF with abdominal pain. Most likely biliary colic. Scheduled for cholecystectomy. No peritonitis. Symptoms improved with meds. Also, indurated painful lesion underneath L breast. No drainable collection. Already taking bactrim/keflex. Should provide more than adequate coverage. PRN pain meds. Return precautions discussed.   I personally preformed the services scribed in my presence. The recorded information has been reviewed is accurate. Summer Manifold, MD.    Summer Manifold, MD 05/24/14 [phone]

## 2014-05-14 NOTE — ED Notes (Signed)
Pt c/o abd pain and vomiting. Pt states she is due to have gallbladder removed Tuesday. Pt also c/o abscess to the left abd.

## 2014-05-14 NOTE — ED Notes (Signed)
Sent pt to waiting area until 12

## 2014-05-14 NOTE — Discharge Instructions (Signed)

## 2014-05-16 NOTE — Patient Instructions (Signed)
SEVEN MARENGO  [birth year]   Your procedure is scheduled on:  Thursday, 05/19/14  Report to [facility] at 48 AM.  Call this number if you have problems the morning of surgery: [phone]   Remember:   Do not eat food or drink liquids after midnight.   Take these medicines the morning of surgery with A SIP OF WATER: pepcid, vicodin or percocet, and zofran or phenergan   Do not wear jewelry, make-up or nail polish.  Do not wear lotions, powders, or perfumes. You may wear deodorant.  Do not shave 48 hours prior to surgery. Men may shave face and neck.  Do not bring valuables to the hospital.  [facility] is not responsible                  for any belongings or valuables.               Contacts, dentures or bridgework may not be worn into surgery.  Leave suitcase in the car. After surgery it may be brought to your room.  For patients admitted to the hospital, discharge time is determined by your                treatment team.               Patients discharged the day of surgery will not be allowed to drive  home.  Name and phone number of your driver: family  Special Instructions: Shower using CHG 2 nights before surgery and the night before surgery.  If you shower the day of surgery use CHG.  Use special wash - you have one bottle of CHG for all showers.  You should use approximately 1/3 of the bottle for each shower.   Please read over the following fact sheets that you were given: Anesthesia Post-op Instructions and Care and Recovery After Surgery   WOUND CARE INSTRUCTIONS (if applicable):  Keep a dry clean dressing on the anesthesia/puncture wound site if there is drainage.  Once the wound has quit draining you may leave it open to air.  Generally you should leave the bandage intact for twenty four hours unless there is drainage.  If the epidural site drains for more than 36-48 hours please call the anesthesia department.  QUESTIONS?:  Please feel free to call your physician or  the hospital operator if you have any questions, and they will be happy to assist you.      Laparoscopic Cholecystectomy, Care After These instructions give you information on caring for yourself after your procedure. Your doctor may also give you more specific instructions. Call your doctor if you have any problems or questions after your procedure.  HOME CARE  Change your bandages (dressings) as told by your doctor.  Keep the wound dry and clean. Wash the wound gently with soap and water. Pat the wound dry with a clean towel.  Do not take baths, swim, or use hot tubs for 2 weeks, or as told by your doctor.  Only take medicine as told by your doctor.  Eat a normal diet as told by your doctor.  Do not lift anything heavier than 10 pounds (4.5 kg) until your doctor says it is okay.  Do not play contact sports for 1 week, or as told by your doctor. GET HELP IF:  Your wound is red, puffy (swollen), or painful.  You have yellowish-white fluid (pus) coming from the wound.  You have fluid draining from the wound  for more than 1 day.  You have a bad smell coming from the wound.  Your wound breaks open. GET HELP RIGHT AWAY IF:   You have a rash.  You have trouble breathing.  You have chest pain.  You have a fever.  You have pain in the shoulders (shoulder strap areas) that is getting worse.  You feel dizzy or pass out (faint).  You have severe belly (abdominal) pain.  You feel sick to your stomach (nauseous) or throw up (vomit) for more than 1 day. Document Released: 08/06/2008 Document Revised: 08/18/2013 Document Reviewed: 06/09/2013 [facility] Patient Information 2015 [place], [place]. This information is not intended to replace advice given to you by your health care provider. Make sure you discuss any questions you have with your health care provider. Laparoscopic Cholecystectomy Laparoscopic cholecystectomy is surgery to remove the gallbladder. The gallbladder is located  in the upper right part of the abdomen, behind the liver. It is a storage sac for bile produced in the liver. Bile aids in the digestion and absorption of fats. Cholecystectomy is often done for inflammation of the gallbladder (cholecystitis). This condition is usually caused by a buildup of gallstones (cholelithiasis) in your gallbladder. Gallstones can block the flow of bile, resulting in inflammation and pain. In severe cases, emergency surgery may be required. When emergency surgery is not required, you will have time to prepare for the procedure. Laparoscopic surgery is an alternative to open surgery. Laparoscopic surgery has a shorter recovery time. Your common bile duct may also need to be examined during the procedure. If stones are found in the common bile duct, they may be removed. LET [facility] CARE PROVIDER KNOW ABOUT:  Any allergies you have.  All medicines you are taking, including vitamins, herbs, eye drops, creams, and over-the-counter medicines.  Previous problems you or members of your family have had with the use of anesthetics.  Any blood disorders you have.  Previous surgeries you have had.  Medical conditions you have. RISKS AND COMPLICATIONS Generally, this is a safe procedure. However, as with any procedure, complications can occur. Possible complications include:  Infection.  Damage to the common bile duct, nerves, arteries, veins, or other internal organs such as the stomach, liver, or intestines.  Bleeding.  A stone may remain in the common bile duct.  A bile leak from the cyst duct that is clipped when your gallbladder is removed.  The need to convert to open surgery, which requires a larger incision in the abdomen. This may be necessary if your surgeon thinks it is not safe to continue with a laparoscopic procedure. BEFORE THE PROCEDURE  Ask your health care provider about changing or stopping any regular medicines. You will need to stop taking aspirin or  blood thinners at least 5 days prior to surgery.  Do not eat or drink anything after midnight the night before surgery.  Let your health care provider know if you develop a cold or other infectious problem before surgery. PROCEDURE   You will be given medicine to make you sleep through the procedure (general anesthetic). A breathing tube will be placed in your mouth.  When you are asleep, your surgeon will make several small cuts (incisions) in your abdomen.  A thin, lighted tube with a tiny camera on the end (laparoscope) is inserted through one of the small incisions. The camera on the laparoscope sends a picture to a TV screen in the operating room. This gives the surgeon a good view inside your  abdomen.  A gas will be pumped into your abdomen. This expands your abdomen so that the surgeon has more room to perform the surgery.  Other tools needed for the procedure are inserted through the other incisions. The gallbladder is removed through one of the incisions.  After the removal of your gallbladder, the incisions will be closed with stitches, staples, or skin glue. AFTER THE PROCEDURE  You will be taken to a recovery area where your progress will be checked often.  You may be allowed to go home the same day if your pain is controlled and you can tolerate liquids. Document Released: 10/28/2005 Document Revised: 08/18/2013 Document Reviewed: 06/09/2013 [facility] Patient Information 2015 [place], [place]. This information is not intended to replace advice given to you by your health care provider. Make sure you discuss any questions you have with your health care provider. PATIENT INSTRUCTIONS POST-ANESTHESIA  IMMEDIATELY FOLLOWING SURGERY:  Do not drive or operate machinery for the first twenty four hours after surgery.  Do not make any important decisions for twenty four hours after surgery or while taking narcotic pain medications or sedatives.  If you develop intractable nausea and  vomiting or a severe headache please notify your doctor immediately.  FOLLOW-UP:  Please make an appointment with your surgeon as instructed. You do not need to follow up with anesthesia unless specifically instructed to do so.

## 2014-05-17 ENCOUNTER — Encounter (HOSPITAL_COMMUNITY)
Admission: RE | Admit: 2014-05-17 | Discharge: 2014-05-17 | Disposition: A | Discharge status: Home / Self Care | Payer: Self-pay | Source: Ambulatory Visit | Attending: General Surgery | Admitting: General Surgery

## 2014-05-20 NOTE — Patient Instructions (Signed)
Summer Bates  [birth year]   Your procedure is scheduled on: 05/27/2014  Report to [facility] at [place] AM.  Call this number if you have problems the morning of surgery: [phone]   Remember:   Do not eat food or drink liquids after midnight.   Take these medicines the morning of surgery with A SIP OF WATER: Pepcid, pain pill, Zofran   Do not wear jewelry, make-up or nail polish.  Do not wear lotions, powders, or perfumes. You may wear deodorant.  Do not shave 48 hours prior to surgery. Men may shave face and neck.  Do not bring valuables to the hospital.  [facility] is not responsible for any belongings or valuables.               Contacts, dentures or bridgework may not be worn into surgery.  Leave suitcase in the car. After surgery it may be brought to your room.  For patients admitted to the hospital, discharge time is determined by your treatment team.               Patients discharged the day of surgery will not be allowed to drive home.  Name and phone number of your driver:  Special Instructions: Shower using CHG 2 nights before surgery and the night before surgery.  If you shower the day of surgery use CHG.  Use special wash - you have one bottle of CHG for all showers.  You should use approximately 1/3 of the bottle for each shower.   Please read over the following fact sheets that you were given: Pain Booklet, Coughing and Deep Breathing, Surgical Site Infection Prevention and Care and Recovery After Surgery\    Laparoscopic Cholecystectomy Laparoscopic cholecystectomy is surgery to remove the gallbladder. The gallbladder is located in the upper right part of the abdomen, behind the liver. It is a storage sac for bile produced in the liver. Bile aids in the digestion and absorption of fats. Cholecystectomy is often done for inflammation of the gallbladder (cholecystitis). This condition is usually caused by a buildup of gallstones (cholelithiasis) in your gallbladder.  Gallstones can block the flow of bile, resulting in inflammation and pain. In severe cases, emergency surgery may be required. When emergency surgery is not required, you will have time to prepare for the procedure. Laparoscopic surgery is an alternative to open surgery. Laparoscopic surgery has a shorter recovery time. Your common bile duct may also need to be examined during the procedure. If stones are found in the common bile duct, they may be removed. LET [facility] CARE PROVIDER KNOW ABOUT:  Any allergies you have.  All medicines you are taking, including vitamins, herbs, eye drops, creams, and over-the-counter medicines.  Previous problems you or members of your family have had with the use of anesthetics.  Any blood disorders you have.  Previous surgeries you have had.  Medical conditions you have. RISKS AND COMPLICATIONS Generally, this is a safe procedure. However, as with any procedure, complications can occur. Possible complications include:  Infection.  Damage to the common bile duct, nerves, arteries, veins, or other internal organs such as the stomach, liver, or intestines.  Bleeding.  A stone may remain in the common bile duct.  A bile leak from the cyst duct that is clipped when your gallbladder is removed.  The need to convert to open surgery, which requires a larger incision in the abdomen. This may be necessary if your surgeon thinks it is not safe  to continue with a laparoscopic procedure. BEFORE THE PROCEDURE  Ask your health care provider about changing or stopping any regular medicines. You will need to stop taking aspirin or blood thinners at least 5 days prior to surgery.  Do not eat or drink anything after midnight the night before surgery.  Let your health care provider know if you develop a cold or other infectious problem before surgery. PROCEDURE   You will be given medicine to make you sleep through the procedure (general anesthetic). A breathing  tube will be placed in your mouth.  When you are asleep, your surgeon will make several small cuts (incisions) in your abdomen.  A thin, lighted tube with a tiny camera on the end (laparoscope) is inserted through one of the small incisions. The camera on the laparoscope sends a picture to a TV screen in the operating room. This gives the surgeon a good view inside your abdomen.  A gas will be pumped into your abdomen. This expands your abdomen so that the surgeon has more room to perform the surgery.  Other tools needed for the procedure are inserted through the other incisions. The gallbladder is removed through one of the incisions.  After the removal of your gallbladder, the incisions will be closed with stitches, staples, or skin glue. AFTER THE PROCEDURE  You will be taken to a recovery area where your progress will be checked often.  You may be allowed to go home the same day if your pain is controlled and you can tolerate liquids. Document Released: 10/28/2005 Document Revised: 08/18/2013 Document Reviewed: 06/09/2013 [facility] Patient Information 2015 [place], [place]. This information is not intended to replace advice given to you by your health care provider. Make sure you discuss any questions you have with your health care provider.

## 2014-05-23 ENCOUNTER — Encounter (HOSPITAL_COMMUNITY)
Admission: RE | Admit: 2014-05-23 | Discharge: 2014-05-23 | Disposition: A | Discharge status: Home / Self Care | Payer: Self-pay | Source: Ambulatory Visit | Attending: General Surgery | Admitting: General Surgery

## 2014-05-23 ENCOUNTER — Encounter (HOSPITAL_COMMUNITY): Payer: Self-pay

## 2014-05-23 DIAGNOSIS — Z01818 Encounter for other preprocedural examination: Secondary | ICD-10-CM | POA: Insufficient documentation

## 2014-05-23 DIAGNOSIS — Z01812 Encounter for preprocedural laboratory examination: Secondary | ICD-10-CM | POA: Insufficient documentation

## 2014-05-23 LAB — CBC WITH DIFFERENTIAL/PLATELET
BASOS ABS: 0 10*3/uL (ref 0.0–0.1)
BASOS PCT: 0 % (ref 0–1)
EOS ABS: 0.1 10*3/uL (ref 0.0–0.7)
Eosinophils Relative: 1 % (ref 0–5)
HCT: 39.1 % (ref 36.0–46.0)
HEMOGLOBIN: 13.3 g/dL (ref 12.0–15.0)
Lymphocytes Relative: 35 % (ref 12–46)
Lymphs Abs: 2.2 10*3/uL (ref 0.7–4.0)
MCH: 30.6 pg (ref 26.0–34.0)
MCHC: 34 g/dL (ref 30.0–36.0)
MCV: 90.1 fL (ref 78.0–100.0)
MONOS PCT: 5 % (ref 3–12)
Monocytes Absolute: 0.3 10*3/uL (ref 0.1–1.0)
NEUTROS ABS: 3.7 10*3/uL (ref 1.7–7.7)
Neutrophils Relative %: 59 % (ref 43–77)
Platelets: 293 10*3/uL (ref 150–400)
RBC: 4.34 MIL/uL (ref 3.87–5.11)
RDW: 12.3 % (ref 11.5–15.5)
WBC: 6.3 10*3/uL (ref 4.0–10.5)

## 2014-05-23 LAB — BASIC METABOLIC PANEL
ANION GAP: 9 (ref 5–15)
BUN: 15 mg/dL (ref 6–23)
CHLORIDE: 101 meq/L (ref 96–112)
CO2: 29 mEq/L (ref 19–32)
CREATININE: 0.78 mg/dL (ref 0.50–1.10)
Calcium: 9.5 mg/dL (ref 8.4–10.5)
Glucose, Bld: 98 mg/dL (ref 70–99)
Potassium: 5.1 mEq/L (ref 3.7–5.3)
Sodium: 139 mEq/L (ref 137–147)

## 2014-05-23 LAB — AMYLASE: Amylase: 66 U/L (ref 0–105)

## 2014-05-23 LAB — HEPATIC FUNCTION PANEL
ALK PHOS: 76 U/L (ref 39–117)
ALT: 24 U/L (ref 0–35)
AST: 14 U/L (ref 0–37)
Albumin: 3.9 g/dL (ref 3.5–5.2)
Bilirubin, Direct: <0.2 mg/dL (ref 0.0–0.3)
Total Protein: 7.1 g/dL (ref 6.0–8.3)

## 2014-05-23 LAB — HCG, SERUM, QUALITATIVE: PREG SERUM: NEGATIVE

## 2014-05-23 NOTE — Consult Note (Deleted)
NAME:  Summer Bates, Summer Bates NO.:  [id number]  MEDICAL RECORD NO.:  [id number]  LOCATION:                                FACILITY:  [facility]  PHYSICIAN:  Felicie Morn, M.D. DATE OF BIRTH:  [birth year]  DATE OF CONSULTATION:  05/27/2014 DATE OF DISCHARGE:  05/30/2014                                CONSULTATION   NOTE:  Surgery was asked to see this 38 year old white female for approximately a year's worth of symptoms of right upper quadrant pain, nausea, and vomiting.  The symptoms were aggravated by food, particularly greasy fried foods, and accompanied with pain radiating to her back.  She was worked up for gallbladder disease as the symptoms strongly suggested this.  She was found to have sludge within the gallbladder, but they did not specifically describe stones.  I then ordered a hepatobiliary scan, which was completely normal.  The patient continued to have right upper quadrant postprandial pain radiating to her back and we manipulated her diet and told her to stay more closely on clear liquids.  When she strayed from her diet, she continued to have these symptoms.  These symptoms on careful discussions were not symptoms of acid reflux, they were different, they were postprandial right upper quadrant symptoms not accompanied with any sort of symptoms of reflux. I offered this patient for thoroughness an upper GI endoscopy evaluation prior to surgery as she did not tolerate diet manipulation very well. She refused this.  I discussed with her that she may still have problems of indigestion after surgeries as we were 100% sure her problems were related to stones, but there could possibly be stones in the sludge that was found on sonogram.  She understood this.  We further discussed the risks of surgery not limited to, but including bleeding, infection, damage to bile ducts, perforation of organs, and the possibility that an open surgery might be required.  We also  discussed that a drain may be needed.  Informed consent was obtained.  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  PAST SURGICAL HISTORY:  T and A done in 2005, she had a history of endometriosis and laparotomy done for that in 2010, and she had a D and C in her 82s.  SOCIAL HISTORY:  She is a nonsmoker and a nondrinker.  ALLERGIES:  She has no known allergies to medications.  PHYSICAL EXAMINATION:  VITAL SIGNS:  She is 5 feet 5 inches, weighs 180 pounds, her temperature is 98.0, her pulse is 96, respirations 14, and her blood pressure 120/80. HEENT:  Head is normocephalic.  Eyes, extraocular movements are intact. Pupils are equal, round, and reactive to light and accommodation. NECK:  She has no bruits.  No adenopathy is appreciated.  No jugular vein distention  or thyromegaly. CHEST:  Clear both to anterior and posterior auscultation. HEART:  Regular rhythm. BREASTS AND AXILLAE:  Without masses. ABDOMEN:  Just in her upper abdomen, the patient has a granulating wound where I excised an abscess in that area.  She is tender in the right upper quadrant, but she has no rebound there.  Bowel sounds are normoactive.  No femoral or inguinal hernias are appreciated. RECTAL:  Deferred. EXTREMITIES:  Within normal limits.  REVIEW OF SYSTEMS:  She has no history of migraines or seizures or any lateralizing neurological findings.  ENDOCRINE:  No history of diabetes, thyroid disease, or adrenal problems.  CARDIOPULMONARY  Grossly within normal limits.  MUSCULOSKELETAL:  Grossly within normal  limits.  OB/GYN: She is a gravida 3, para 2, cesarean 0, abortus 71 female whose paternal grandmother had carcinoma of the breast.  She has had  no mammogram. GI:  No history of hepatitis, constipation, diarrhea, bright red rectal bleeding, black tarry stools, or history of inflammatory bowel disease, or irritable bowel syndrome.  No history of unexplained weight loss. The patient has had in the past some history of GERD.  GU:  No history of frequency, dysuria, or kidney stones.  REVIEW OF HISTORY AND PHYSICAL:  Therefore, Summer Bates is a 38 year old white female who has signs and symptoms of gallbladder disease I think related to the sludge that is dependent to her gallbladder and she was also noted on examination to have Murphy sign when that sonogram was taken place.  Her biliary scan, however, is completely normal, and she refuses an upper GI endoscopy evaluation prior to surgery that was offered to her.  We discussed surgery with her in great detail, discussing that she may still have problems with indigestion postoperatively.  We discussed the risks of the surgery and informed consent was obtained.  We will plan for surgery this week.  She is to continue to dress her healing wound on her upper abdomen and we will plan for surgery as soon as we are able.  PRESENT MEDICATIONS:  She is on some Percocet for this pain and she takes Pepcid 20 mg b.i.d. and Phenergan 25 mg per rectum q.6 hours p.r.n.  As stated, she is a nonsmoker and nondrinker.     Felicie Morn, M.D.     WB/MEDQ  D:  05/23/2014  T:  05/23/2014  Job:  [id number]

## 2014-05-23 NOTE — Consult Note (Signed)
NAME:  Summer Bates, Summer Bates NO.:  [id number]  MEDICAL RECORD NO.:  [id number]  LOCATION:                                FACILITY:  [facility]  PHYSICIAN:  Felicie Morn, M.D. DATE OF BIRTH:  01/23/1976  DATE OF CONSULTATION:  05/27/2014 DATE OF DISCHARGE:  05/30/2014                                CONSULTATION   NOTE:  Surgery was asked to see this 38 year old white female for approximately a year's worth of symptoms of right upper quadrant pain, nausea, and vomiting.  The symptoms were aggravated by food, particularly greasy fried foods, and accompanied with pain radiating to her back.  She was worked up for gallbladder disease as the symptoms strongly suggested this.  She was found to have sludge within the gallbladder, but they did not specifically describe stones.  I then ordered a hepatobiliary scan, which was completely normal.  The patient continued to have right upper quadrant postprandial pain radiating to her back and we manipulated her diet and told her to stay more closely on clear liquids.  When she strayed from her diet, she continued to have these symptoms.  These symptoms on careful discussions were not symptoms of acid reflux, they were different, they were postprandial right upper quadrant symptoms not accompanied with any sort of symptoms of reflux. I offered this patient for thoroughness an upper GI endoscopy evaluation prior to surgery as she did not tolerate diet manipulation very well. She refused this.  I discussed with her that she may still have problems of indigestion after surgeries as we were 100% sure her problems were related to stones, but there could possibly be stones in the sludge that was found on sonogram.  She understood this.  We further discussed the risks of surgery not limited to, but including bleeding, infection, damage to bile ducts, perforation of organs, and the possibility that an open surgery might be required.  We also  discussed that a drain may be needed.  Informed consent was obtained.  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  PAST SURGICAL HISTORY:  T and A done in 2005, she had a history of endometriosis and laparotomy done for that in 2010, and she had a D and C in her 82s.  SOCIAL HISTORY:  She is a nonsmoker and a nondrinker.  ALLERGIES:  She has no known allergies to medications.  PHYSICAL EXAMINATION:  VITAL SIGNS:  She is 5 feet 5 inches, weighs 180 pounds, her temperature is 98.0, her pulse is 96, respirations 14, and her blood pressure 120/80. HEENT:  Head is normocephalic.  Eyes, extraocular movements are intact. Pupils are equal, round, and reactive to light and accommodation. NECK:  She has no bruits.  No adenopathy is appreciated.  No jugular vein distention  or thyromegaly. CHEST:  Clear both to anterior and posterior auscultation. HEART:  Regular rhythm. BREASTS AND AXILLAE:  Without masses. ABDOMEN:  Just in her upper abdomen, the patient has a granulating wound where I excised an abscess in that area.  She is tender in the right upper quadrant, but she has no rebound there.  Bowel sounds are normoactive.  No femoral or inguinal hernias are appreciated. RECTAL:  Deferred. EXTREMITIES:  Within normal limits.  REVIEW OF SYSTEMS:  She has no history of migraines or seizures or any lateralizing neurological findings.  ENDOCRINE:  No history of diabetes, thyroid disease, or adrenal problems.  CARDIOPULMONARY  Grossly within normal limits.  MUSCULOSKELETAL:  Grossly within normal  limits.  OB/GYN: She is a gravida 3, para 2, cesarean 0, abortus 61 female whose paternal grandmother had carcinoma of the breast.  She has had  no mammogram. GI:  No history of hepatitis, constipation, diarrhea, bright red rectal bleeding, black tarry stools, or history of inflammatory bowel disease, or irritable bowel syndrome.  No history of unexplained weight loss. The patient has had in the past some history of GERD.  GU:  No history of frequency, dysuria, or kidney stones.  REVIEW OF HISTORY AND PHYSICAL:  Therefore, [name] is a 38 year old white female who has signs and symptoms of gallbladder disease I think related to the sludge that is dependent to her gallbladder and she was also noted on examination to have Murphy sign when that sonogram was taken place.  Her biliary scan, however, is completely normal, and she refuses an upper GI endoscopy evaluation prior to surgery that was offered to her.  We discussed surgery with her in great detail, discussing that she may still have problems with indigestion postoperatively.  We discussed the risks of the surgery and informed consent was obtained.  We will plan for surgery this week.  She is to continue to dress her healing wound on her upper abdomen and we will plan for surgery as soon as we are able.  PRESENT MEDICATIONS:  She is on some Percocet for this pain and she takes Pepcid 20 mg b.i.d. and Phenergan 25 mg per rectum q.6 hours p.r.n.  As stated, she is a nonsmoker and nondrinker.     Felicie Morn, M.D.     WB/MEDQ  D:  05/23/2014  T:  05/23/2014  Job:  [id number]

## 2014-05-27 ENCOUNTER — Encounter (HOSPITAL_COMMUNITY): Payer: Self-pay | Admitting: Anesthesiology

## 2014-05-27 ENCOUNTER — Encounter (HOSPITAL_COMMUNITY): Payer: Self-pay

## 2014-05-27 ENCOUNTER — Ambulatory Visit (HOSPITAL_COMMUNITY)
Admission: RE | Admit: 2014-05-27 | Discharge: 2014-05-30 | Disposition: A | Discharge status: Home / Self Care | Payer: Self-pay | Source: Ambulatory Visit | Attending: General Surgery | Admitting: General Surgery

## 2014-05-27 ENCOUNTER — Ambulatory Visit (HOSPITAL_COMMUNITY): Payer: Self-pay | Admitting: Anesthesiology

## 2014-05-27 ENCOUNTER — Encounter (HOSPITAL_COMMUNITY)
Admission: RE | Disposition: A | Discharge status: Home / Self Care | Payer: Self-pay | Source: Ambulatory Visit | Attending: General Surgery

## 2014-05-27 DIAGNOSIS — K811 Chronic cholecystitis: Secondary | ICD-10-CM | POA: Insufficient documentation

## 2014-05-27 DIAGNOSIS — G8918 Other acute postprocedural pain: Secondary | ICD-10-CM | POA: Insufficient documentation

## 2014-05-27 DIAGNOSIS — K819 Cholecystitis, unspecified: Secondary | ICD-10-CM | POA: Diagnosis present

## 2014-05-27 DIAGNOSIS — Z8614 Personal history of Methicillin resistant Staphylococcus aureus infection: Secondary | ICD-10-CM | POA: Insufficient documentation

## 2014-05-27 HISTORY — PX: CHOLECYSTECTOMY: SHX55

## 2014-05-27 SURGERY — LAPAROSCOPIC CHOLECYSTECTOMY
Anesthesia: General | Site: Abdomen

## 2014-05-27 MED ORDER — DOCUSATE SODIUM 100 MG PO CAPS
100.0000 mg | ORAL_CAPSULE | Freq: Two times a day (BID) | ORAL | Status: DC
Start: 2014-05-27 — End: 2014-05-30
  Administered 2014-05-27 – 2014-05-30 (×7): 100 mg via ORAL
  Filled 2014-05-27 (×7): qty 1

## 2014-05-27 MED ORDER — KETOROLAC TROMETHAMINE 30 MG/ML IJ SOLN
15.0000 mg | Freq: Four times a day (QID) | INTRAMUSCULAR | Status: DC | PRN
  Administered 2014-05-27: 15 mg via INTRAVENOUS

## 2014-05-27 MED ORDER — ONDANSETRON HCL 4 MG/2ML IJ SOLN
INTRAMUSCULAR | Status: AC
  Filled 2014-05-27: qty 2

## 2014-05-27 MED ORDER — FENTANYL CITRATE 0.05 MG/ML IJ SOLN
INTRAMUSCULAR | Status: AC
  Filled 2014-05-27: qty 2

## 2014-05-27 MED ORDER — FAMOTIDINE 20 MG PO TABS
20.0000 mg | ORAL_TABLET | Freq: Two times a day (BID) | ORAL | Status: DC
  Administered 2014-05-27 – 2014-05-30 (×7): 20 mg via ORAL
  Filled 2014-05-27 (×7): qty 1

## 2014-05-27 MED ORDER — SODIUM CHLORIDE 0.9 % IR SOLN
Status: DC | PRN
  Administered 2014-05-27: 1000 mL

## 2014-05-27 MED ORDER — MIDAZOLAM HCL 2 MG/2ML IJ SOLN
1.0000 mg | INTRAMUSCULAR | Status: DC | PRN
  Administered 2014-05-27 (×2): 2 mg via INTRAVENOUS
  Filled 2014-05-27: qty 2

## 2014-05-27 MED ORDER — HYDROMORPHONE HCL PF 1 MG/ML IJ SOLN
INTRAMUSCULAR | Status: AC
  Filled 2014-05-27: qty 1

## 2014-05-27 MED ORDER — FENTANYL CITRATE 0.05 MG/ML IJ SOLN
INTRAMUSCULAR | Status: DC | PRN
  Administered 2014-05-27: 100 ug via INTRAVENOUS
  Administered 2014-05-27 (×3): 50 ug via INTRAVENOUS
  Administered 2014-05-27: 25 ug via INTRAVENOUS
  Administered 2014-05-27: 50 ug via INTRAVENOUS
  Administered 2014-05-27: 25 ug via INTRAVENOUS

## 2014-05-27 MED ORDER — DEXAMETHASONE SODIUM PHOSPHATE 4 MG/ML IJ SOLN
4.0000 mg | Freq: Once | INTRAMUSCULAR | Status: AC
  Administered 2014-05-27: 4 mg via INTRAVENOUS

## 2014-05-27 MED ORDER — FENTANYL CITRATE 0.05 MG/ML IJ SOLN
50.0000 ug | Freq: Once | INTRAMUSCULAR | Status: AC
  Administered 2014-05-27: 50 ug via INTRAVENOUS

## 2014-05-27 MED ORDER — HEMOSTATIC AGENTS (NO CHARGE) OPTIME
TOPICAL | Status: DC | PRN
  Administered 2014-05-27: 1 via TOPICAL

## 2014-05-27 MED ORDER — KETOROLAC TROMETHAMINE 15 MG/ML IJ SOLN
15.0000 mg | Freq: Four times a day (QID) | INTRAMUSCULAR | Status: DC | PRN
  Administered 2014-05-27 – 2014-05-29 (×6): 15 mg via INTRAVENOUS
  Filled 2014-05-27 (×6): qty 1

## 2014-05-27 MED ORDER — PROPOFOL 10 MG/ML IV BOLUS
INTRAVENOUS | Status: DC | PRN
  Administered 2014-05-27: 200 mg via INTRAVENOUS

## 2014-05-27 MED ORDER — GLYCOPYRROLATE 0.2 MG/ML IJ SOLN
0.2000 mg | Freq: Once | INTRAMUSCULAR | Status: AC
  Administered 2014-05-27: 0.2 mg via INTRAVENOUS

## 2014-05-27 MED ORDER — FENTANYL CITRATE 0.05 MG/ML IJ SOLN
25.0000 ug | INTRAMUSCULAR | Status: DC | PRN
  Administered 2014-05-27 (×4): 50 ug via INTRAVENOUS
  Filled 2014-05-27: qty 2

## 2014-05-27 MED ORDER — ROCURONIUM BROMIDE 100 MG/10ML IV SOLN
INTRAVENOUS | Status: DC | PRN
  Administered 2014-05-27: 50 mg via INTRAVENOUS

## 2014-05-27 MED ORDER — LACTATED RINGERS IV SOLN
INTRAVENOUS | Status: DC
  Administered 2014-05-27: 07:00:00 via INTRAVENOUS

## 2014-05-27 MED ORDER — LORAZEPAM 1 MG PO TABS
1.0000 mg | ORAL_TABLET | Freq: Every evening | ORAL | Status: DC | PRN
  Administered 2014-05-27 – 2014-05-28 (×2): 1 mg via ORAL
  Filled 2014-05-27 (×2): qty 1

## 2014-05-27 MED ORDER — DEXAMETHASONE SODIUM PHOSPHATE 4 MG/ML IJ SOLN
INTRAMUSCULAR | Status: AC
  Filled 2014-05-27: qty 1

## 2014-05-27 MED ORDER — POTASSIUM CHLORIDE IN NACL 20-0.9 MEQ/L-% IV SOLN
INTRAVENOUS | Status: DC
  Administered 2014-05-27 – 2014-05-29 (×7): via INTRAVENOUS

## 2014-05-27 MED ORDER — GLYCOPYRROLATE 0.2 MG/ML IJ SOLN
INTRAMUSCULAR | Status: DC | PRN
  Administered 2014-05-27: 0.4 mg via INTRAVENOUS

## 2014-05-27 MED ORDER — WATER FOR IRRIGATION, STERILE IR SOLN
Status: DC | PRN
  Administered 2014-05-27 (×2): 1000 mL

## 2014-05-27 MED ORDER — NEOSTIGMINE METHYLSULFATE 10 MG/10ML IV SOLN
INTRAVENOUS | Status: DC | PRN
Start: 2014-05-27 — End: 2014-05-29
  Administered 2014-05-27: 3 mg via INTRAVENOUS

## 2014-05-27 MED ORDER — LIDOCAINE HCL (CARDIAC) 20 MG/ML IV SOLN
INTRAVENOUS | Status: DC | PRN
  Administered 2014-05-27: 30 mg via INTRAVENOUS

## 2014-05-27 MED ORDER — FENTANYL CITRATE 0.05 MG/ML IJ SOLN
INTRAMUSCULAR | Status: AC
  Filled 2014-05-27: qty 5

## 2014-05-27 MED ORDER — MORPHINE SULFATE 2 MG/ML IJ SOLN
1.0000 mg | INTRAMUSCULAR | Status: DC | PRN
  Administered 2014-05-27: 1 mg via INTRAVENOUS
  Filled 2014-05-27: qty 1

## 2014-05-27 MED ORDER — GLYCOPYRROLATE 0.2 MG/ML IJ SOLN
INTRAMUSCULAR | Status: AC
  Filled 2014-05-27: qty 1

## 2014-05-27 MED ORDER — ONDANSETRON HCL 4 MG/2ML IJ SOLN
4.0000 mg | Freq: Once | INTRAMUSCULAR | Status: AC | PRN
  Administered 2014-05-27: 4 mg via INTRAVENOUS
  Filled 2014-05-27: qty 2

## 2014-05-27 MED ORDER — ONDANSETRON HCL 4 MG PO TABS
4.0000 mg | ORAL_TABLET | Freq: Four times a day (QID) | ORAL | Status: DC | PRN
  Administered 2014-05-29: 4 mg via ORAL
  Filled 2014-05-27: qty 1

## 2014-05-27 MED ORDER — HYDROMORPHONE HCL PF 1 MG/ML IJ SOLN
1.0000 mg | INTRAMUSCULAR | Status: DC | PRN
  Administered 2014-05-27 – 2014-05-29 (×15): 1 mg via INTRAVENOUS
  Filled 2014-05-27 (×14): qty 1

## 2014-05-27 MED ORDER — LACTATED RINGERS IV SOLN
INTRAVENOUS | Status: DC | PRN
Start: 2014-05-27 — End: 2014-05-29
  Administered 2014-05-27 (×2): via INTRAVENOUS

## 2014-05-27 MED ORDER — SODIUM CHLORIDE 0.9 % IR SOLN
Status: DC | PRN
  Administered 2014-05-27: 3000 mL

## 2014-05-27 MED ORDER — BUPIVACAINE HCL (PF) 0.5 % IJ SOLN
INTRAMUSCULAR | Status: AC
  Filled 2014-05-27: qty 30

## 2014-05-27 MED ORDER — DEXTROSE 5 % IV SOLN
1.0000 g | Freq: Once | INTRAVENOUS | Status: AC
  Administered 2014-05-27: 1 g via INTRAVENOUS
  Filled 2014-05-27: qty 10

## 2014-05-27 MED ORDER — BUPIVACAINE HCL (PF) 0.5 % IJ SOLN
INTRAMUSCULAR | Status: DC | PRN
  Administered 2014-05-27: 10 mL

## 2014-05-27 MED ORDER — ONDANSETRON HCL 4 MG/2ML IJ SOLN
4.0000 mg | Freq: Once | INTRAMUSCULAR | Status: AC
  Administered 2014-05-27: 4 mg via INTRAVENOUS

## 2014-05-27 MED ORDER — MIDAZOLAM HCL 2 MG/2ML IJ SOLN
INTRAMUSCULAR | Status: AC
  Filled 2014-05-27: qty 2

## 2014-05-27 MED ORDER — ONDANSETRON HCL 4 MG/2ML IJ SOLN
4.0000 mg | Freq: Four times a day (QID) | INTRAMUSCULAR | Status: DC | PRN
  Administered 2014-05-27 – 2014-05-29 (×4): 4 mg via INTRAVENOUS
  Filled 2014-05-27 (×4): qty 2

## 2014-05-27 SURGICAL SUPPLY — 69 items
APPLICATOR COTTON TIP 6IN STRL (MISCELLANEOUS) ×3 IMPLANT
APPLIER CLIP LAPSCP 10X32 DD (CLIP) ×3 IMPLANT
ATTRACTOMAT 16X20 MAGNETIC DRP (DRAPES) IMPLANT
BAG HAMPER (MISCELLANEOUS) ×3 IMPLANT
BLADE 15 SAFETY STRL DISP (BLADE) ×3 IMPLANT
CLOTH BEACON ORANGE TIMEOUT ST (SAFETY) ×3 IMPLANT
COVER LIGHT HANDLE STERIS (MISCELLANEOUS) ×6 IMPLANT
DECANTER SPIKE VIAL GLASS SM (MISCELLANEOUS) ×3 IMPLANT
DISSECTOR BLUNT TIP ENDO 5MM (MISCELLANEOUS) ×3 IMPLANT
DRAPE WARM FLUID 44X44 (DRAPE) IMPLANT
DRSG TEGADERM 2-3/8X2-3/4 SM (GAUZE/BANDAGES/DRESSINGS) ×9 IMPLANT
ELECT BLADE 6 FLAT ULTRCLN (ELECTRODE) IMPLANT
ELECT REM PT RETURN 9FT ADLT (ELECTROSURGICAL) ×3
ELECTRODE REM PT RTRN 9FT ADLT (ELECTROSURGICAL) ×1 IMPLANT
EVACUATOR DRAINAGE 10X20 100CC (DRAIN) ×1 IMPLANT
EVACUATOR SILICONE 100CC (DRAIN) ×2
FILTER SMOKE EVAC LAPAROSHD (FILTER) ×3 IMPLANT
FORMALIN 10 PREFIL 120ML (MISCELLANEOUS) ×3 IMPLANT
GAUZE SPONGE 4X4 12PLY STRL (GAUZE/BANDAGES/DRESSINGS) ×2 IMPLANT
GLOVE BIOGEL M 7.0 STRL (GLOVE) ×3 IMPLANT
GLOVE BIOGEL PI IND STRL 7.0 (GLOVE) ×2 IMPLANT
GLOVE BIOGEL PI INDICATOR 7.0 (GLOVE) ×4
GLOVE ECLIPSE 6.5 STRL STRAW (GLOVE) ×3 IMPLANT
GLOVE EXAM NITRILE MD LF STRL (GLOVE) ×6 IMPLANT
GLOVE SKINSENSE NS SZ7.0 (GLOVE) ×2
GLOVE SKINSENSE STRL SZ7.0 (GLOVE) ×1 IMPLANT
GOWN STRL REUS W/TWL LRG LVL3 (GOWN DISPOSABLE) ×9 IMPLANT
HEMOSTAT SURGICEL 4X8 (HEMOSTASIS) ×3 IMPLANT
INST SET LAPROSCOPIC AP (KITS) ×3 IMPLANT
IV NS IRRIG 3000ML ARTHROMATIC (IV SOLUTION) ×3 IMPLANT
KIT ROOM TURNOVER APOR (KITS) ×3 IMPLANT
MANIFOLD NEPTUNE II (INSTRUMENTS) ×3 IMPLANT
NEEDLE INSUFFLATION 14GA 120MM (NEEDLE) ×3 IMPLANT
NS IRRIG 1000ML POUR BTL (IV SOLUTION) ×3 IMPLANT
PACK LAP CHOLE LZT030E (CUSTOM PROCEDURE TRAY) ×3 IMPLANT
PAD ARMBOARD 7.5X6 YLW CONV (MISCELLANEOUS) ×3 IMPLANT
PENCIL HANDSWITCHING (ELECTRODE) IMPLANT
POUCH SPECIMEN RETRIEVAL 10MM (ENDOMECHANICALS) ×3 IMPLANT
SET BASIN LINEN APH (SET/KITS/TRAYS/PACK) ×3 IMPLANT
SET TUBE IRRIG SUCTION NO TIP (IRRIGATION / IRRIGATOR) ×3 IMPLANT
SLEEVE ENDOPATH XCEL 5M (ENDOMECHANICALS) ×3 IMPLANT
SOL PREP PROV IODINE SCRUB 4OZ (MISCELLANEOUS) ×3 IMPLANT
SPONGE DRAIN TRACH 4X4 STRL 2S (GAUZE/BANDAGES/DRESSINGS) ×3 IMPLANT
SPONGE GAUZE 2X2 8PLY STER LF (GAUZE/BANDAGES/DRESSINGS) ×4
SPONGE GAUZE 2X2 8PLY STRL LF (GAUZE/BANDAGES/DRESSINGS) ×8 IMPLANT
SPONGE GAUZE 4X4 12PLY (GAUZE/BANDAGES/DRESSINGS) ×3 IMPLANT
SPONGE INTESTINAL PEANUT (DISPOSABLE) IMPLANT
SPONGE LAP 18X18 X RAY DECT (DISPOSABLE) IMPLANT
STAPLER VISISTAT 35W (STAPLE) ×3 IMPLANT
SUT ETHILON 3 0 FSL (SUTURE) ×3 IMPLANT
SUT SILK 2 0 (SUTURE)
SUT SILK 2 0 SH (SUTURE) IMPLANT
SUT SILK 2-0 18XBRD TIE 12 (SUTURE) IMPLANT
SUT SILK 3 0 SH CR/8 (SUTURE) IMPLANT
SUT VIC AB 0 CT1 27 (SUTURE)
SUT VIC AB 0 CT1 27XBRD ANTBC (SUTURE) IMPLANT
SUT VIC AB 0 CT1 27XCR 8 STRN (SUTURE) IMPLANT
SUT VICRYL 0 UR6 27IN ABS (SUTURE) ×3 IMPLANT
SYR BULB IRRIGATION 50ML (SYRINGE) IMPLANT
TAPE CLOTH SURG 4X10 WHT LF (GAUZE/BANDAGES/DRESSINGS) ×3 IMPLANT
TOWEL OR 17X26 4PK STRL BLUE (TOWEL DISPOSABLE) ×3 IMPLANT
TRAY FOLEY CATH 16FR SILVER (SET/KITS/TRAYS/PACK) ×3 IMPLANT
TROCAR ENDO BLADELESS 11MM (ENDOMECHANICALS) ×3 IMPLANT
TROCAR XCEL NON-BLD 5MMX100MML (ENDOMECHANICALS) ×3 IMPLANT
TROCAR XCEL UNIV SLVE 11M 100M (ENDOMECHANICALS) ×3 IMPLANT
TUBING INSUFFLATION HIGH FLOW (TUBING) ×3 IMPLANT
WARMER LAPAROSCOPE (MISCELLANEOUS) ×3 IMPLANT
WATER STERILE IRR 1000ML POUR (IV SOLUTION) ×6 IMPLANT
YANKAUER SUCT BULB TIP 10FT TU (MISCELLANEOUS) IMPLANT

## 2014-05-27 NOTE — Anesthesia Postprocedure Evaluation (Signed)
  Anesthesia Post-op Note  Patient: Summer Bates  Procedure(s) Performed: Procedure(s): LAPAROSCOPIC CHOLECYSTECTOMY (N/A)  Patient Location: PACU  Anesthesia Type:General  Level of Consciousness: awake, sedated and patient cooperative  Airway and Oxygen Therapy: Patient Spontanous Breathing and Patient connected to nasal cannula oxygen  Post-op Pain: mild, moderate  Post-op Assessment: Post-op Vital signs reviewed and Patient's Cardiovascular Status Stable  Post-op Vital Signs: stable  Last Vitals:  Filed Vitals:   05/27/14 0904  BP: 121/64  Pulse:   Temp: 36.6 C  Resp: 22    Complications: No apparent anesthesia complications

## 2014-05-27 NOTE — Progress Notes (Signed)
Filed Vitals:   05/27/14 0627  BP: 110/71  Pulse: 93  Temp: 97.7 F (36.5 C)  Resp: 18    38 yr. Old W. Female for elective cholecystectomy.  Pt has gall bladder cholic like symptoms for a year with findings of sludge in Gallbladder on sonogram.  Diet manipulation and anti reflux treatment did not ameliorate these symptoms.  We [planned for cholecystectomy via the OP dept. After treatment of skin infection. (The pt. has a history of MRSA.)  Abdomen is soft and skin infection is now clean.  Surgery and risks have been discussed in detail and informed consent obtained.  No clinical change since H&P.

## 2014-05-27 NOTE — Brief Op Note (Signed)
05/27/2014  9:11 AM  PATIENT:  Doroteo Glassman  38 y.o. female  PRE-OPERATIVE DIAGNOSIS:  cholecystitis  POST-OPERATIVE DIAGNOSIS:  cholecystitis  PROCEDURE:  Procedure(s): LAPAROSCOPIC CHOLECYSTECTOMY (N/A)  SURGEON:  Surgeon(s) and Role:    * Scherry Ran, MD - Primary  PHYSICIAN ASSISTANT:   ASSISTANTS: none   ANESTHESIA:   general  EBL:  Total I/O In: 1600 [I.V.:1600] Out: 10 [Urine:5; Blood:5]  BLOOD ADMINISTERED:none  DRAINS: Penrose drain in the liver bed.   LOCAL MEDICATIONS USED:  MARCAINE   0.5%  ~ 10 cc  SPECIMEN:  Source of Specimen:  gall bladder  DISPOSITION OF SPECIMEN:  PATHOLOGY  COUNTS:  YES  TOURNIQUET:  * No tourniquets in log *  DICTATION: .Other Dictation: Dictation Number OR dict. # [id number].  PLAN OF CARE: Admit for overnight observation  PATIENT DISPOSITION:  PACU - hemodynamically stable.   Delay start of Pharmacological VTE agent (>24hrs) due to surgical blood loss or risk of bleeding: not applicable

## 2014-05-27 NOTE — Op Note (Signed)
NAME:  Summer Bates, Summer Bates           ACCOUNT NO.:  [id number]  MEDICAL RECORD NO.:  [id number]  LOCATION:  [facility]                          FACILITY:  [facility]  PHYSICIAN:  Felicie Morn, M.D. DATE OF BIRTH:  Dec 11, 1975  DATE OF PROCEDURE:  05/27/2014 DATE OF DISCHARGE:                              OPERATIVE REPORT   DIAGNOSIS:  Cholecystitis secondary to sludge or possibly stones.  PROCEDURE:  Laparoscopic cholecystectomy.  SPECIMEN:  Gallbladder.  NOTE:  This is a 38 year old white female who had a year history of right upper quadrant pain,  nausea and vomiting.  She had a family history of gallbladder disease as well.  She was referred to my office after a sonogram revealed the presence of sludge in her gallbladder.  I ordered a hepatobiliary scan since the stones were not specifically diagnosed, this was normal.  We manipulated her diet.  She continued to have problems with right upper quadrant pain.  This pain was not reflux or GERD type symptoms.  For completeness, I offered her upper GI endoscopy.  She refused this and we planned for laparoscopic cholecystectomy after explaining the risks and benefits.  I discussed complications not limited to, but including bleeding, infection, damage to bile ducts, perforation of organs, transitory diarrhea, and the possibility of an open surgery might be required.  Also stated that the results for cholecystectomy for sludge may not render the same results as it is for stones and that she may continue to have some form of dyspepsia.  Informed consent was obtained.  GROSS OPERATIVE FINDINGS:  The patient had adhesions surrounding the distal form of the gallbladder.  There was some sludge palpated in the gallbladder possibly stones.  The right upper quadrant otherwise apart from the adhesions were grossly within normal limits.  TECHNIQUE:  The patient was placed in supine position.  After the adequate administration of general anesthesia via  endotracheal intubation, a Foley catheter was aseptically inserted and the patient was prepped with Betadine solution and draped in usual manner.  The patient had a previous wound that we had taken care of as an outpatient, and this was no longer in an infected state and sterile OpSite dressing however was placed over this as added precaution. We then made a periumbilical incision and grasped the fascia and elevated this with the Veress needle placed and confirmed in position with a saline drop test. We then insufflated the abdomen with approximately 3-3.2 L of CO2 and then under direct vision placed 11-mm cannula in the umbilicus and 50-NL cannula in the epigastrium and two 5-mm cannulas in the right upper quadrant laterally.  The gallbladder was grasped.  Adhesions were taken down.  A small cystic duct was identified.  This was triply silver clipped and divided as was the cystic artery.  The gallbladder was then removed uneventfully with no leakage from the liver bed.  There was small amount of oozing which was controlled with the cautery device.  We then irrigated with normal saline solution.  I elected to leave a Jackson-Pratt drain in the liver bed as well as some Surgicel in the liver bed.  We desufflated the abdomen and closed the larger incisions in the epigastrium and the  umbilicus area with 0 Polysorb and then irrigated these wounds then closed them with the stapling device.  We used approximately 10 mL 0.5% Sensorcaine to help with postoperative comfort.  The patient received approximately 1500 mL of crystalloids intraoperatively.  There were no complications and the patient was taken to recovery room in satisfactory condition.     Felicie Morn, M.D.     WB/MEDQ  D:  05/27/2014  T:  05/27/2014  Job:  [id number]

## 2014-05-27 NOTE — Progress Notes (Signed)
Called Dr. Romona Curls with an update regarding patient status. MD acknowledged and asked RN to reinforce and continue to encourage ambulation. Patient promises after more pain medication she will ambulate. Dr. Romona Curls ordered Ativan 1mg  PO QHSPRN for sleep.

## 2014-05-27 NOTE — Anesthesia Preprocedure Evaluation (Signed)
Anesthesia Evaluation  Patient identified by MRN, date of birth, ID band Patient awake    Reviewed: Allergy & Precautions, NPO status , Patient's Chart, lab work & pertinent test results  Airway Mallampati: II TM Distance: >3 FB Neck ROM: Full    Dental  (+) Teeth Intact   Pulmonary neg pulmonary ROS,  breath sounds clear to auscultation        Cardiovascular negative cardio ROS  Rhythm:Regular Rate:Normal     Neuro/Psych    GI/Hepatic negative GI ROS,   Endo/Other    Renal/GU      Musculoskeletal   Abdominal   Peds  Hematology   Anesthesia Other Findings   Reproductive/Obstetrics                           Anesthesia Physical Anesthesia Plan  ASA: I  Anesthesia Plan: General   Post-op Pain Management:    Induction: Intravenous, Rapid sequence and Cricoid pressure planned  Airway Management Planned: Oral ETT  Additional Equipment:   Intra-op Plan:   Post-operative Plan: Extubation in OR  Informed Consent: I have reviewed the patients History and Physical, chart, labs and discussed the procedure including the risks, benefits and alternatives for the proposed anesthesia with the patient or authorized representative who has indicated his/her understanding and acceptance.     Plan Discussed with:   Anesthesia Plan Comments:         Anesthesia Quick Evaluation

## 2014-05-27 NOTE — Transfer of Care (Signed)
Immediate Anesthesia Transfer of Care Note  Patient: Summer Bates  Procedure(s) Performed: Procedure(s): LAPAROSCOPIC CHOLECYSTECTOMY (N/A)  Patient Location: PACU  Anesthesia Type:General  Level of Consciousness: sedated and patient cooperative  Airway & Oxygen Therapy: Patient Spontanous Breathing and Patient connected to nasal cannula oxygen  Post-op Assessment: Report given to PACU RN and Post -op Vital signs reviewed and stable  Post vital signs: stable  Complications: No apparent anesthesia complications

## 2014-05-27 NOTE — Progress Notes (Signed)
Post OP Check   Filed Vitals:   05/27/14 1311  BP: 113/75  Pulse: 86  Temp: 98 F (36.7 C)  Resp: 18  Awake and alert. C/O considerable incisional pain.  Dressings dry and in tact with serosanguinous drainage noted.  Pt has voided.  Have increased pain meds

## 2014-05-27 NOTE — Progress Notes (Signed)
UR chart review completed.  

## 2014-05-28 LAB — CBC
HEMATOCRIT: 32.6 % — AB (ref 36.0–46.0)
Hemoglobin: 11.1 g/dL — ABNORMAL LOW (ref 12.0–15.0)
MCH: 30.7 pg (ref 26.0–34.0)
MCHC: 34 g/dL (ref 30.0–36.0)
MCV: 90.1 fL (ref 78.0–100.0)
Platelets: 214 10*3/uL (ref 150–400)
RBC: 3.62 MIL/uL — ABNORMAL LOW (ref 3.87–5.11)
RDW: 12.4 % (ref 11.5–15.5)
WBC: 6.5 10*3/uL (ref 4.0–10.5)

## 2014-05-28 LAB — HEPATIC FUNCTION PANEL
ALBUMIN: 2.9 g/dL — AB (ref 3.5–5.2)
ALK PHOS: 51 U/L (ref 39–117)
ALT: 35 U/L (ref 0–35)
AST: 26 U/L (ref 0–37)
BILIRUBIN TOTAL: 0.3 mg/dL (ref 0.3–1.2)
Bilirubin, Direct: <0.2 mg/dL (ref 0.0–0.3)
Total Protein: 5.5 g/dL — ABNORMAL LOW (ref 6.0–8.3)

## 2014-05-28 LAB — CBC WITH DIFFERENTIAL/PLATELET
Basophils Absolute: 0 10*3/uL (ref 0.0–0.1)
Basophils Relative: 0 % (ref 0–1)
EOS ABS: 0.1 10*3/uL (ref 0.0–0.7)
EOS PCT: 1 % (ref 0–5)
HEMATOCRIT: 32.1 % — AB (ref 36.0–46.0)
Hemoglobin: 11 g/dL — ABNORMAL LOW (ref 12.0–15.0)
LYMPHS ABS: 2.4 10*3/uL (ref 0.7–4.0)
Lymphocytes Relative: 38 % (ref 12–46)
MCH: 30.8 pg (ref 26.0–34.0)
MCHC: 34.3 g/dL (ref 30.0–36.0)
MCV: 89.9 fL (ref 78.0–100.0)
MONO ABS: 0.4 10*3/uL (ref 0.1–1.0)
Monocytes Relative: 6 % (ref 3–12)
Neutro Abs: 3.5 10*3/uL (ref 1.7–7.7)
Neutrophils Relative %: 55 % (ref 43–77)
PLATELETS: 208 10*3/uL (ref 150–400)
RBC: 3.57 MIL/uL — ABNORMAL LOW (ref 3.87–5.11)
RDW: 12.4 % (ref 11.5–15.5)
WBC: 6.3 10*3/uL (ref 4.0–10.5)

## 2014-05-28 LAB — BASIC METABOLIC PANEL
Anion gap: 7 (ref 5–15)
BUN: 6 mg/dL (ref 6–23)
CHLORIDE: 105 meq/L (ref 96–112)
CO2: 28 meq/L (ref 19–32)
Calcium: 8.2 mg/dL — ABNORMAL LOW (ref 8.4–10.5)
Creatinine, Ser: 0.72 mg/dL (ref 0.50–1.10)
GFR calc Af Amer: >90 mL/min (ref >90)
GFR calc non Af Amer: >90 mL/min (ref >90)
Glucose, Bld: 93 mg/dL (ref 70–99)
Potassium: 4 mEq/L (ref 3.7–5.3)
Sodium: 140 mEq/L (ref 137–147)

## 2014-05-28 NOTE — Progress Notes (Signed)
Surgery  Filed Vitals:   05/28/14 1413  BP: 86/57  Pulse: 85  Temp: 97.4 F (36.3 C)  Resp: 18   Pt still requiring pain meds.   Her father states she has a very low threshold for pain.  Pt voiding well.  Serous drainage from JP.  Pt voiding well.  Have encouraged ambulation and IS.  Hopefully she will be discharged in AM.

## 2014-05-28 NOTE — Progress Notes (Signed)
Nutrition Brief Note  Patient identified on the Malnutrition Screening Tool (MST) Report  Wt Readings from Last 15 Encounters:  05/27/14 186 lb (84.369 kg)  05/27/14 186 lb (84.369 kg)  05/23/14 186 lb (84.369 kg)  05/14/14 180 lb (81.647 kg)  05/03/14 180 lb (81.647 kg)  04/29/14 180 lb (81.647 kg)   Pt admitted for elective lap chole, performed on 7/171/15. Noted 40 ml drainage from JP drain. MD held off on discharge today due to pain control.  Body mass index is 30.95 kg/(m^2). 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   Current diet order is clear liquid, patient is consuming approximately 100% of meals at this time. Labs and medications reviewed.   No nutrition interventions warranted at this time. If nutrition issues arise, please consult RD.   Summer Bates, RD, LDN Pager: [phone]

## 2014-05-28 NOTE — Anesthesia Postprocedure Evaluation (Signed)
  Anesthesia Post-op Note  Patient: Summer Bates  Procedure(s) Performed: Procedure(s): LAPAROSCOPIC CHOLECYSTECTOMY (N/A)  Patient Location: Nursing Unit  Anesthesia Type:General  Level of Consciousness: awake, alert  and oriented  Airway and Oxygen Therapy: Patient Spontanous Breathing  Post-op Pain: none  Post-op Assessment: Post-op Vital signs reviewed  Post-op Vital Signs: Reviewed and stable  Last Vitals:  Filed Vitals:   05/28/14 0559  BP: 96/62  Pulse: 79  Temp: 36.8 C  Resp: 18    Complications: No apparent anesthesia complications

## 2014-05-28 NOTE — Progress Notes (Signed)
POD # 1  Filed Vitals:   05/28/14 0559  BP: 96/62  Pulse: 79  Temp: 98.2 F (36.8 C)  Resp: 18  Drop in BP related to pain meds. Pt not tachy. No sign of active post op bleeding.  Pt still C/O incisional pain which has limited her mobility.  She feels only a bit better than last night and she is requiring parenteral pain meds therefore, will not discharge.  Abdomen is soft.  Wounds clean and dry and redressed.  JP drainage is serous in nature ~ 40 cc over night.  Pt voiding well and frequently.   Labs show no bump in bilirubin.  Hopefully with ambulation will feel better and can discharge soon.

## 2014-05-29 ENCOUNTER — Encounter (HOSPITAL_COMMUNITY): Payer: Self-pay | Admitting: General Surgery

## 2014-05-29 LAB — BASIC METABOLIC PANEL
Anion gap: 10 (ref 5–15)
BUN: 4 mg/dL — AB (ref 6–23)
CALCIUM: 8.4 mg/dL (ref 8.4–10.5)
CO2: 27 meq/L (ref 19–32)
Chloride: 103 mEq/L (ref 96–112)
Creatinine, Ser: 0.71 mg/dL (ref 0.50–1.10)
GFR calc Af Amer: >90 mL/min (ref >90)
GFR calc non Af Amer: >90 mL/min (ref >90)
GLUCOSE: 89 mg/dL (ref 70–99)
Potassium: 3.9 mEq/L (ref 3.7–5.3)
Sodium: 140 mEq/L (ref 137–147)

## 2014-05-29 LAB — CBC
HEMATOCRIT: 34.1 % — AB (ref 36.0–46.0)
Hemoglobin: 11.5 g/dL — ABNORMAL LOW (ref 12.0–15.0)
MCH: 30.5 pg (ref 26.0–34.0)
MCHC: 33.7 g/dL (ref 30.0–36.0)
MCV: 90.5 fL (ref 78.0–100.0)
PLATELETS: 224 10*3/uL (ref 150–400)
RBC: 3.77 MIL/uL — ABNORMAL LOW (ref 3.87–5.11)
RDW: 12.5 % (ref 11.5–15.5)
WBC: 5.8 10*3/uL (ref 4.0–10.5)

## 2014-05-29 MED ORDER — LORAZEPAM 1 MG PO TABS
1.0000 mg | ORAL_TABLET | Freq: Three times a day (TID) | ORAL | Status: DC | PRN
  Administered 2014-05-29 – 2014-05-30 (×2): 1 mg via ORAL
  Filled 2014-05-29 (×2): qty 1

## 2014-05-29 MED ORDER — OXYCODONE-ACETAMINOPHEN 5-325 MG PO TABS
1.0000 | ORAL_TABLET | ORAL | Status: DC | PRN
  Administered 2014-05-29 – 2014-05-30 (×6): 1 via ORAL
  Filled 2014-05-29 (×6): qty 1

## 2014-05-29 NOTE — Progress Notes (Signed)
POD # 2  Filed Vitals:   05/29/14 0602  BP: 104/73  Pulse: 89  Temp: 98.1 F (36.7 C)  Resp: 18  pt. Feeling better with less tenderness. Voiding well and wound is clean with only serous JP drainage. Abdomen is soft. Labs OK.  She has difficult home situation and she is apprehensive about being alone and she is trying to obtain some one to pick her up tomorrow and stay with her at home.   She states her father is not really helpful as he is bipolar.   Told thjat we will D./C dilaudid and give onl;y PO pain meds so she can transition to home environment.  We will encourage ambulation and increase diet.  Home tomorrow.

## 2014-05-29 NOTE — Plan of Care (Signed)
Problem: Phase I Progression Outcomes Goal: OOB as tolerated unless otherwise ordered Outcome: Progressing Pt up in room to bathroom, and has ambulated in hall a couple of times.

## 2014-05-29 NOTE — Progress Notes (Signed)
Late entry: pt stated that she had been anxious, when I entered room she had calmed down some, but was still quite anxious, questioning why she only has 1 percocet ordered. I explained that it is procedure to come off of IV pain meds before d/c, patient will likely be d/c tomorrow, and so she needs to use po meds. IV dilaudid was d/c this afternoon. Pt got frustrated and stated that her mom would just have to bring her something. I asked her to clarify what she meant, and informed her that she could not take any medications from outside of the hospital. I told her that we had to attempt the medications ordered and then we could contact the physician if they did not work. Order received for ativan to calm the patient down. When I reassessed pain had decreased and pt was resting with eyes closed at change of shift. Marry Guan

## 2014-05-30 MED ORDER — OXYCODONE-ACETAMINOPHEN 5-325 MG PO TABS: 1.0000 | ORAL_TABLET | ORAL | Status: DC | PRN

## 2014-05-30 MED ORDER — DSS 100 MG PO CAPS: 100.0000 mg | ORAL_CAPSULE | Freq: Two times a day (BID) | ORAL | Status: DC

## 2014-05-30 NOTE — Progress Notes (Signed)
POD # 3  Filed Vitals:   05/30/14 0608  BP: 100/67  Pulse: 85  Temp: 98.7 F (37.1 C)  Resp: 18    Pt feeling  Much better.  Wound clean and dry with only 30  Cc of serous drainage since last night.  Abdomen very soft.  JP drain was removed and drain site dressed and wound care explained.  Discharge and follow up arranged   Discharge dict # [phone].

## 2014-05-30 NOTE — Progress Notes (Signed)
Patient being d/c home with prescriptions and instructions. IV cath removed and intact. No pain/swelling at site. Verbalizes understanding of dressing changes and importance of keeping clean and dry. No c/o pain at this time.

## 2014-05-31 NOTE — Discharge Summary (Signed)
Summer Bates, Summer Bates           ACCOUNT NO.:  [id number]  MEDICAL RECORD NO.:  [id number]  LOCATION:  [facility]                          FACILITY:  [facility]  PHYSICIAN:  Felicie Morn, M.D. DATE OF BIRTH:  1976-03-01  DATE OF ADMISSION:  05/27/2014 DATE OF DISCHARGE:  07/20/2015LH                              DISCHARGE SUMMARY   DIAGNOSIS:  Cholecystitis.  PROCEDURE:  Laparoscopic cholecystectomy on May 27, 2014.  NOTE:  This is a 38 year old white female who was admitted via the outpatient department after approximately a year history of right upper quadrant postprandial pain, nausea and vomiting.  Sonogram revealed sludge in the gallbladder.  Hepatobiliary scan was normal.  We discussed surgery via the outpatient department with attended risks and informed consent was obtained.  The patient's postoperative course was complicated somewhat that she had a very low threshold for pain, requiring parental pain meds little longer than usual.  Also, she had issues at home with a bipolar father that delayed discharge as well, otherwise the patient did well postoperatively.  Her wound was clean.  She was voiding well at the time of discharge.  She had no leg pain, shortness of breath, and postoperatively, she had no bump in her bilirubin.  Her JP drainage was serous in nature and diminishing, and her drain was removed on third postoperative day.  At the time of discharge, she was also tolerating p.o.  She was somewhat apprehensive about pain control as an outpatient and we had a long discussion about this and we will discharge her on some p.o. Percocet, but she is told to try Tylenol first and if that does not help to take some Percocet 4 hours afterwards and she has been given on a prescription for limited amount of these with no refills and she is told to increase her diet and her activity as tolerated.  For laboratory datas, please consult chart.  We will follow up with  her perioperatively and followup arrangements were made.     Felicie Morn, M.D.     WB/MEDQ  D:  05/30/2014  T:  05/31/2014  Job:  [id number]

## 2014-12-21 IMAGING — NM NM HEPATO W/GB/PHARM/[PERSON_NAME]
2 series · 12 of 12 positions shown · non-contrast
Comparison: None.

CLINICAL DATA: Right upper quadrant pain

EXAM:
NUCLEAR MEDICINE HEPATOBILIARY IMAGING WITH GALLBLADDER EF
TECHNIQUE: Sequential images of the abdomen were obtained [DATE] minutes
following intravenous administration of radiopharmaceutical. After
slow intravenous infusion of 1.68 micrograms Cholecystokinin,
gallbladder ejection fraction was determined.
RADIOPHARMACEUTICALS:  5.0 Millicurie Yc-XXm Choletec

[Series 1: biliary · 3.25mm/px · 6 of 60 frames shown]
[frame 6/60]
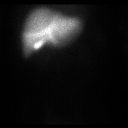
[frame 16/60]
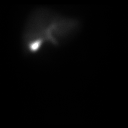
[frame 26/60]
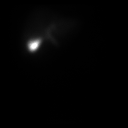
[frame 36/60]
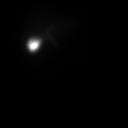
[frame 46/60]
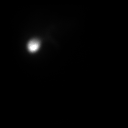
[frame 56/60]
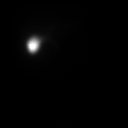

[Series 2: gbef · 3.25mm/px · 6 of 30 frames shown]
[frame 3/30]
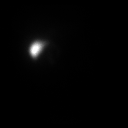
[frame 8/30]
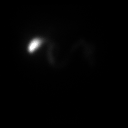
[frame 13/30]
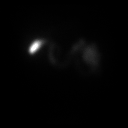
[frame 18/30]
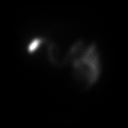
[frame 23/30]
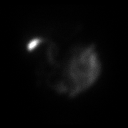
[frame 28/30]
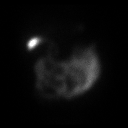

[12 of 12 positions shown; findings below may reference images not displayed]

FINDINGS: There is immediate homogeneous uptake of radiotracer in the liver.
Filling of the gallbladder begins at 5 minutes. Radiotracer uptake
is present in the small bowel following CCK injection.

At the end of 1-hour, there is relatively good clearance of activity
from the liver.

When gallbladder filling was complete, the patient was given an IV
infusion of CCK. At 30 min the gallbladder ejection fraction
measures 85%. At 30 min, normal ejection fraction is greater than
30%.

The patient did not experience symptoms during CCK infusion.
IMPRESSION: Normal hepatobiliary scan.  Normal gallbladder ejection fraction.

## 2015-06-05 ENCOUNTER — Emergency Department (HOSPITAL_COMMUNITY)
Admission: EM | Admit: 2015-06-05 | Discharge: 2015-06-05 | Disposition: A | Discharge status: Home / Self Care | Payer: Self-pay | Attending: Emergency Medicine | Admitting: Emergency Medicine

## 2015-06-05 ENCOUNTER — Emergency Department (HOSPITAL_COMMUNITY): Payer: Self-pay

## 2015-06-05 ENCOUNTER — Encounter (HOSPITAL_COMMUNITY): Payer: Self-pay | Admitting: *Deleted

## 2015-06-05 DIAGNOSIS — R102 Pelvic and perineal pain: Secondary | ICD-10-CM | POA: Insufficient documentation

## 2015-06-05 DIAGNOSIS — Z79899 Other long term (current) drug therapy: Secondary | ICD-10-CM | POA: Insufficient documentation

## 2015-06-05 DIAGNOSIS — Z9049 Acquired absence of other specified parts of digestive tract: Secondary | ICD-10-CM | POA: Insufficient documentation

## 2015-06-05 DIAGNOSIS — Z3202 Encounter for pregnancy test, result negative: Secondary | ICD-10-CM | POA: Insufficient documentation

## 2015-06-05 DIAGNOSIS — Z9889 Other specified postprocedural states: Secondary | ICD-10-CM | POA: Insufficient documentation

## 2015-06-05 DIAGNOSIS — R110 Nausea: Secondary | ICD-10-CM | POA: Insufficient documentation

## 2015-06-05 DIAGNOSIS — R630 Anorexia: Secondary | ICD-10-CM | POA: Insufficient documentation

## 2015-06-05 DIAGNOSIS — Z9851 Tubal ligation status: Secondary | ICD-10-CM | POA: Insufficient documentation

## 2015-06-05 LAB — CBC WITH DIFFERENTIAL/PLATELET
BASOS ABS: 0 10*3/uL (ref 0.0–0.1)
Basophils Relative: 0 % (ref 0–1)
EOS ABS: 0.1 10*3/uL (ref 0.0–0.7)
EOS PCT: 1 % (ref 0–5)
HCT: 36.4 % (ref 36.0–46.0)
HEMOGLOBIN: 12.4 g/dL (ref 12.0–15.0)
LYMPHS PCT: 31 % (ref 12–46)
Lymphs Abs: 2.6 10*3/uL (ref 0.7–4.0)
MCH: 30.5 pg (ref 26.0–34.0)
MCHC: 34.1 g/dL (ref 30.0–36.0)
MCV: 89.4 fL (ref 78.0–100.0)
MONO ABS: 0.5 10*3/uL (ref 0.1–1.0)
Monocytes Relative: 6 % (ref 3–12)
NEUTROS PCT: 62 % (ref 43–77)
Neutro Abs: 5.4 10*3/uL (ref 1.7–7.7)
Platelets: 250 10*3/uL (ref 150–400)
RBC: 4.07 MIL/uL (ref 3.87–5.11)
RDW: 12.6 % (ref 11.5–15.5)
WBC: 8.6 10*3/uL (ref 4.0–10.5)

## 2015-06-05 LAB — COMPREHENSIVE METABOLIC PANEL
ALK PHOS: 44 U/L (ref 38–126)
ALT: 16 U/L (ref 14–54)
AST: 17 U/L (ref 15–41)
Albumin: 3.7 g/dL (ref 3.5–5.0)
Anion gap: 7 (ref 5–15)
BUN: 12 mg/dL (ref 6–20)
CHLORIDE: 106 mmol/L (ref 101–111)
CO2: 26 mmol/L (ref 22–32)
CREATININE: 0.85 mg/dL (ref 0.44–1.00)
Calcium: 8.5 mg/dL — ABNORMAL LOW (ref 8.9–10.3)
GLUCOSE: 90 mg/dL (ref 65–99)
POTASSIUM: 3.8 mmol/L (ref 3.5–5.1)
Sodium: 139 mmol/L (ref 135–145)
Total Bilirubin: 0.4 mg/dL (ref 0.3–1.2)
Total Protein: 6.2 g/dL — ABNORMAL LOW (ref 6.5–8.1)

## 2015-06-05 LAB — URINALYSIS, ROUTINE W REFLEX MICROSCOPIC
BILIRUBIN URINE: NEGATIVE
GLUCOSE, UA: NEGATIVE mg/dL
Hgb urine dipstick: NEGATIVE
Leukocytes, UA: NEGATIVE
NITRITE: NEGATIVE
PH: 7 (ref 5.0–8.0)
Protein, ur: NEGATIVE mg/dL
SPECIFIC GRAVITY, URINE: 1.02 (ref 1.005–1.030)
UROBILINOGEN UA: 0.2 mg/dL (ref 0.0–1.0)

## 2015-06-05 LAB — LIPASE, BLOOD: LIPASE: 15 U/L — AB (ref 22–51)

## 2015-06-05 LAB — WET PREP, GENITAL
CLUE CELLS WET PREP: NONE SEEN
TRICH WET PREP: NONE SEEN
WBC, Wet Prep HPF POC: NONE SEEN
YEAST WET PREP: NONE SEEN

## 2015-06-05 LAB — PREGNANCY, URINE: Preg Test, Ur: NEGATIVE

## 2015-06-05 MED ORDER — NAPROXEN 500 MG PO TABS: 500.0000 mg | ORAL_TABLET | Freq: Two times a day (BID) | ORAL | Status: AC

## 2015-06-05 MED ORDER — OXYCODONE-ACETAMINOPHEN 5-325 MG PO TABS: 1.0000 | ORAL_TABLET | ORAL | Status: AC | PRN

## 2015-06-05 MED ORDER — HYDROCODONE-ACETAMINOPHEN 5-325 MG PO TABS: 2.0000 | ORAL_TABLET | ORAL | Status: DC | PRN

## 2015-06-05 MED ORDER — ONDANSETRON HCL 4 MG/2ML IJ SOLN
4.0000 mg | Freq: Once | INTRAMUSCULAR | Status: AC
  Administered 2015-06-05: 4 mg via INTRAVENOUS
  Filled 2015-06-05: qty 2

## 2015-06-05 MED ORDER — HYDROMORPHONE HCL 1 MG/ML IJ SOLN
1.0000 mg | Freq: Once | INTRAMUSCULAR | Status: AC
  Administered 2015-06-05: 1 mg via INTRAVENOUS
  Filled 2015-06-05: qty 1

## 2015-06-05 MED ORDER — KETOROLAC TROMETHAMINE 30 MG/ML IJ SOLN
30.0000 mg | Freq: Once | INTRAMUSCULAR | Status: AC
  Administered 2015-06-05: 30 mg via INTRAVENOUS
  Filled 2015-06-05: qty 1

## 2015-06-05 MED ORDER — MORPHINE SULFATE 4 MG/ML IJ SOLN
4.0000 mg | Freq: Once | INTRAMUSCULAR | Status: AC
  Administered 2015-06-05: 4 mg via INTRAVENOUS
  Filled 2015-06-05: qty 1

## 2015-06-05 NOTE — ED Notes (Signed)
Pt with LLQ for past two days, pt believes she has an ovarian cyst

## 2015-06-05 NOTE — ED Provider Notes (Signed)
I have informed the patient of all of her results, I have given her a copy of her results in paper form. She appears well, she is aware of her hemorrhagic cyst and will follow up outpatient with gynecology. To go pack of Vicodin given for the night, she is aware of the indications for return including worsening pain vomiting or fevers. At this time she appears stable for discharge.       Summer Chapel, MD 06/05/15 [phone]

## 2015-06-05 NOTE — ED Provider Notes (Signed)
CSN: [id number]     Arrival date & time 06/05/15  1830 History   First MD Initiated Contact with Patient 06/05/15 2101     Chief Complaint  Patient presents with  . Abdominal Pain     (Consider location/radiation/quality/duration/timing/severity/associated sxs/prior Treatment) HPI Comments:  Patient presents with left-sided lower abdominal pain for the past 2 days. Pain is constant. Nothing makes it better nothing makes it worse. She took a friend's Percocet without relief. She reports nausea but no vomiting. Denies any fever, chills, urinary or vaginal symptoms.  No vaginal bleeding or discharge. No dysuria or hematuria. States normal bowel movements. No diarrhea. No vomiting. Poor appetite. Last menstrual cycle 2 weeks ago. Patient states history of endometriosis and this feels similar.  Patient is a 39 y.o. female presenting with abdominal pain. The history is provided by the patient.  Abdominal Pain Associated symptoms: nausea   Associated symptoms: no chest pain, no cough, no dysuria, no fatigue, no fever, no hematuria, no shortness of breath, no vaginal bleeding, no vaginal discharge and no vomiting     Past Medical History  Diagnosis Date  . Endometriosis    Past Surgical History  Procedure Laterality Date  . Tonsillectomy    . Tubal ligation    . Cholecystectomy N/A 05/27/2014    Procedure: LAPAROSCOPIC CHOLECYSTECTOMY;  Surgeon: Scherry Ran, MD;  Location: [facility];  Service: General;  Laterality: N/A;   History reviewed. No pertinent family history. History  Substance Use Topics  . Smoking status: Never Smoker   . Smokeless tobacco: Not on file  . Alcohol Use: 0.6 oz/week    1 Glasses of wine per week   OB History    No data available     Review of Systems  Constitutional: Positive for appetite change. Negative for fever, activity change and fatigue.  HENT: Negative for congestion and rhinorrhea.   Respiratory: Negative for cough, chest tightness and  shortness of breath.   Cardiovascular: Negative for chest pain.  Gastrointestinal: Positive for nausea and abdominal pain. Negative for vomiting.  Genitourinary: Negative for dysuria, hematuria, vaginal bleeding and vaginal discharge.  Musculoskeletal: Negative for myalgias, back pain, arthralgias and neck pain.  Skin: Negative for rash.  Neurological: Negative for dizziness, weakness and headaches.  A complete 10 system review of systems was obtained and all systems are negative except as noted in the HPI and PMH.      Allergies  Review of patient's allergies indicates no known allergies.  Home Medications   Prior to Admission medications   Medication Sig Start Date End Date Taking? Authorizing Provider  Aspirin-Salicylamide-Caffeine (BC HEADACHE) 325-95-16 MG TABS Take 1-2 packets by mouth daily as needed (for pain).   Yes Historical Provider, MD  naproxen sodium (ANAPROX) 220 MG tablet Take 220-440 mg by mouth daily as needed (for pain).   Yes Historical Provider, MD  naproxen (NAPROSYN) 500 MG tablet Take 1 tablet (500 mg total) by mouth 2 (two) times daily. 06/05/15   Ezequiel Essex, MD  oxyCODONE-acetaminophen (PERCOCET/ROXICET) 5-325 MG per tablet Take 1 tablet by mouth every 4 (four) hours as needed for severe pain. 06/05/15   Ezequiel Essex, MD   BP 140/71 mmHg  Pulse 101  Temp(Src) 97.8 F (36.6 C) (Oral)  Resp 20  Ht 5\' 5"  (1.651 m)  Wt 180 lb (81.647 kg)  BMI 29.95 kg/m2  SpO2 98%  LMP 05/25/2015 Physical Exam  Constitutional: She is oriented to person, place, and time. She appears well-developed and well-nourished.  No distress.  HENT:  Head: Normocephalic and atraumatic.  Mouth/Throat: Oropharynx is clear and moist. No oropharyngeal exudate.  Eyes: Conjunctivae and EOM are normal. Pupils are equal, round, and reactive to light.  Neck: Normal range of motion. Neck supple.  No meningismus.  Cardiovascular: Normal rate, regular rhythm, normal heart sounds and  intact distal pulses.   No murmur heard. Pulmonary/Chest: Effort normal and breath sounds normal. No respiratory distress.  Abdominal: Soft. There is tenderness. There is no rebound and no guarding.  TTP LLQ, no guarding or rebound  Genitourinary:   Chaperone present. Normal external genitalia. No vaginal discharge. No CMT. There is uterine and left adnexal tenderness.  Musculoskeletal: Normal range of motion. She exhibits no edema or tenderness.  No CVAT  Neurological: She is alert and oriented to person, place, and time. No cranial nerve deficit. She exhibits normal muscle tone. Coordination normal.  No ataxia on finger to nose bilaterally. No pronator drift. 5/5 strength throughout. CN 2-12 intact. Negative Romberg. Equal grip strength. Sensation intact. Gait is normal.   Skin: Skin is warm.  Psychiatric: She has a normal mood and affect. Her behavior is normal.  Nursing note and vitals reviewed.   ED Course  Procedures (including critical care time) Labs Review Labs Reviewed  URINALYSIS, ROUTINE W REFLEX MICROSCOPIC (NOT AT [facility]) - Abnormal; Notable for the following:    Ketones, ur TRACE (*)    All other components within normal limits  COMPREHENSIVE METABOLIC PANEL - Abnormal; Notable for the following:    Calcium 8.5 (*)    Total Protein 6.2 (*)    All other components within normal limits  LIPASE, BLOOD - Abnormal; Notable for the following:    Lipase 15 (*)    All other components within normal limits  WET PREP, GENITAL  PREGNANCY, URINE  CBC WITH DIFFERENTIAL/PLATELET  GC/CHLAMYDIA PROBE AMP ([place]) NOT AT [facility]    Imaging Review No results found.   EKG Interpretation None      MDM   Final diagnoses:  Pelvic pain in female  Pelvic pain in female    left-sided pelvic pain for the past 2 days it is constant. No vomiting. No urinary or vaginal symptoms. No diarrhea.   urinalysis negative. HCG negative. Patient with significant left-sided adnexal  tenderness on pelvic exam. Pain is controlled in the ED with multiple doses of narcotics.  Her abdomen is soft without any peritoneal signs.   Ultrasound will be obtained to rule out torsion. Care transferred to Dr. Sabra Heck at shift change.  Ezequiel Essex, MD 06/05/15 2229

## 2015-06-05 NOTE — ED Notes (Signed)
Patient was given a prepackage of Hydrocodone-Acetaminophen quantity six and verbal instructions on use, patient verbally understands.

## 2015-06-05 NOTE — Discharge Instructions (Signed)
Pelvic Pain Female pelvic pain can be caused by many different things and start from a variety of places. Pelvic pain refers to pain that is located in the lower half of the abdomen and between your hips. The pain may occur over a short period of time (acute) or may be reoccurring (chronic). The cause of pelvic pain may be related to disorders affecting the female reproductive organs (gynecologic), but it may also be related to the bladder, kidney stones, an intestinal complication, or muscle or skeletal problems. Getting help right away for pelvic pain is important, especially if there has been severe, sharp, or a sudden onset of unusual pain. It is also important to get help right away because some types of pelvic pain can be life threatening.  CAUSES  Below are only some of the causes of pelvic pain. The causes of pelvic pain can be in one of several categories.   Gynecologic.  Pelvic inflammatory disease.  Sexually transmitted infection.  Ovarian cyst or a twisted ovarian ligament (ovarian torsion).  Uterine lining that grows outside the uterus (endometriosis).  Fibroids, cysts, or tumors.  Ovulation.  Pregnancy.  Pregnancy that occurs outside the uterus (ectopic pregnancy).  Miscarriage.  Labor.  Abruption of the placenta or ruptured uterus.  Infection.  Uterine infection (endometritis).  Bladder infection.  Diverticulitis.  Miscarriage related to a uterine infection (septic abortion).  Bladder.  Inflammation of the bladder (cystitis).  Kidney stone(s).  Gastrointestinal.  Constipation.  Diverticulitis.  Neurologic.  Trauma.  Feeling pelvic pain because of mental or emotional causes (psychosomatic).  Cancers of the bowel or pelvis. EVALUATION  Your caregiver will want to take a careful history of your concerns. This includes recent changes in your health, a careful gynecologic history of your periods (menses), and a sexual history. Obtaining your family  history and medical history is also important. Your caregiver may suggest a pelvic exam. A pelvic exam will help identify the location and severity of the pain. It also helps in the evaluation of which organ system may be involved. In order to identify the cause of the pelvic pain and be properly treated, your caregiver may order tests. These tests may include:   A pregnancy test.  Pelvic ultrasonography.  An X-ray exam of the abdomen.  A urinalysis or evaluation of vaginal discharge.  Blood tests. HOME CARE INSTRUCTIONS   Only take over-the-counter or prescription medicines for pain, discomfort, or fever as directed by your caregiver.   Rest as directed by your caregiver.   Eat a balanced diet.   Drink enough fluids to make your urine clear or pale yellow, or as directed.   Avoid sexual intercourse if it causes pain.   Apply warm or cold compresses to the lower abdomen depending on which one helps the pain.   Avoid stressful situations.   Keep a journal of your pelvic pain. Write down when it started, where the pain is located, and if there are things that seem to be associated with the pain, such as food or your menstrual cycle.  Follow up with your caregiver as directed.  SEEK MEDICAL CARE IF:  Your medicine does not help your pain.  You have abnormal vaginal discharge. SEEK IMMEDIATE MEDICAL CARE IF:   You have heavy bleeding from the vagina.   Your pelvic pain increases.   You feel light-headed or faint.   You have chills.   You have pain with urination or blood in your urine.   You have uncontrolled diarrhea   or vomiting.   You have a fever or persistent symptoms for more than 3 days.  You have a fever and your symptoms suddenly get worse.   You are being physically or sexually abused.  MAKE SURE YOU:  Understand these instructions.  Will watch your condition.  Will get help if you are not doing well or get worse. Document Released:  09/24/2004 Document Revised: 03/14/2014 Document Reviewed: 02/17/2012 [employer] Patient Information 2015 [employer]. This information is not intended to replace advice given to you by your health care provider. Make sure you discuss any questions you have with your health care provider.  

## 2015-06-07 LAB — GC/CHLAMYDIA PROBE AMP (~~LOC~~) NOT AT ARMC
CHLAMYDIA, DNA PROBE: NEGATIVE
Neisseria Gonorrhea: NEGATIVE

## 2015-06-13 MED FILL — Hydrocodone-Acetaminophen Tab 5-325 MG: ORAL | Qty: 6 | Status: AC

## 2015-07-18 ENCOUNTER — Emergency Department (HOSPITAL_COMMUNITY)
Admission: EM | Admit: 2015-07-18 | Discharge: 2015-07-18 | Disposition: A | Discharge status: Home / Self Care | Payer: Self-pay | Attending: Emergency Medicine | Admitting: Emergency Medicine

## 2015-07-18 ENCOUNTER — Encounter (HOSPITAL_COMMUNITY): Payer: Self-pay

## 2015-07-18 DIAGNOSIS — Z8742 Personal history of other diseases of the female genital tract: Secondary | ICD-10-CM | POA: Insufficient documentation

## 2015-07-18 DIAGNOSIS — Z3202 Encounter for pregnancy test, result negative: Secondary | ICD-10-CM | POA: Insufficient documentation

## 2015-07-18 DIAGNOSIS — Z791 Long term (current) use of non-steroidal anti-inflammatories (NSAID): Secondary | ICD-10-CM | POA: Insufficient documentation

## 2015-07-18 DIAGNOSIS — M545 Low back pain: Secondary | ICD-10-CM | POA: Insufficient documentation

## 2015-07-18 LAB — PREGNANCY, URINE: Preg Test, Ur: NEGATIVE

## 2015-07-18 LAB — URINALYSIS, ROUTINE W REFLEX MICROSCOPIC
BILIRUBIN URINE: NEGATIVE
Glucose, UA: NEGATIVE mg/dL
Hgb urine dipstick: NEGATIVE
KETONES UR: NEGATIVE mg/dL
Leukocytes, UA: NEGATIVE
Nitrite: NEGATIVE
Protein, ur: NEGATIVE mg/dL
Specific Gravity, Urine: >1.03 — ABNORMAL HIGH (ref 1.005–1.030)
Urobilinogen, UA: 0.2 mg/dL (ref 0.0–1.0)
pH: 5.5 (ref 5.0–8.0)

## 2015-07-18 MED ORDER — KETOROLAC TROMETHAMINE 10 MG PO TABS
10.0000 mg | ORAL_TABLET | Freq: Once | ORAL | Status: AC
  Administered 2015-07-18: 10 mg via ORAL
  Filled 2015-07-18: qty 1

## 2015-07-18 MED ORDER — HYDROCODONE-ACETAMINOPHEN 5-325 MG PO TABS: 1.0000 | ORAL_TABLET | ORAL | Status: DC | PRN

## 2015-07-18 MED ORDER — ACETAMINOPHEN 325 MG PO TABS
650.0000 mg | ORAL_TABLET | Freq: Once | ORAL | Status: AC
  Administered 2015-07-18: 650 mg via ORAL
  Filled 2015-07-18: qty 2

## 2015-07-18 MED ORDER — DICLOFENAC SODIUM 75 MG PO TBEC: 75.0000 mg | DELAYED_RELEASE_TABLET | Freq: Two times a day (BID) | ORAL | Status: AC

## 2015-07-18 NOTE — ED Provider Notes (Signed)
CSN: [id number]     Arrival date & time 07/18/15  0818 History   First MD Initiated Contact with Patient 07/18/15 0915     Chief Complaint  Patient presents with  . Back Pain     (Consider location/radiation/quality/duration/timing/severity/associated sxs/prior Treatment) Patient is a 39 y.o. female presenting with back pain. The history is provided by the patient.  Back Pain Location:  Lumbar spine Radiates to:  L thigh Pain severity:  Moderate Pain is:  Same all the time Onset quality:  Gradual Duration:  4 days Timing:  Intermittent Progression:  Worsening Chronicity:  Recurrent Context comment:  Pain comes just before menses. Hx of ovarian cyst Relieved by:  Nothing Worsened by:  Movement, standing and twisting Ineffective treatments: BC powders. Associated symptoms: no bladder incontinence, no bowel incontinence, no dysuria and no perianal numbness   Risk factors: no recent surgery     Past Medical History  Diagnosis Date  . Endometriosis    Past Surgical History  Procedure Laterality Date  . Tonsillectomy    . Tubal ligation    . Cholecystectomy N/A 05/27/2014    Procedure: LAPAROSCOPIC CHOLECYSTECTOMY;  Surgeon: Scherry Ran, MD;  Location: [facility];  Service: General;  Laterality: N/A;   No family history on file. Social History  Substance Use Topics  . Smoking status: Never Smoker   . Smokeless tobacco: None  . Alcohol Use: 0.6 oz/week    1 Glasses of wine per week     Comment: occ   OB History    No data available     Review of Systems  Gastrointestinal: Negative for bowel incontinence.  Genitourinary: Negative for bladder incontinence, dysuria, vaginal bleeding and vaginal discharge.  Musculoskeletal: Positive for back pain.  All other systems reviewed and are negative.     Allergies  Review of patient's allergies indicates no known allergies.  Home Medications   Prior to Admission medications   Medication Sig Start Date End Date  Taking? Authorizing Provider  Aspirin-Salicylamide-Caffeine (BC HEADACHE) 325-95-16 MG TABS Take 1-2 packets by mouth daily as needed (for pain).    Historical Provider, MD  HYDROcodone-acetaminophen (NORCO/VICODIN) 5-325 MG per tablet Take 2 tablets by mouth every 4 (four) hours as needed for moderate pain. 06/05/15   Noemi Chapel, MD  naproxen (NAPROSYN) 500 MG tablet Take 1 tablet (500 mg total) by mouth 2 (two) times daily. 06/05/15   Ezequiel Essex, MD  naproxen sodium (ANAPROX) 220 MG tablet Take 220-440 mg by mouth daily as needed (for pain).    Historical Provider, MD  oxyCODONE-acetaminophen (PERCOCET/ROXICET) 5-325 MG per tablet Take 1 tablet by mouth every 4 (four) hours as needed for severe pain. 06/05/15   Ezequiel Essex, MD   BP 113/78 mmHg  Pulse 73  Temp(Src) 98.2 F (36.8 C) (Oral)  Resp 18  Ht 5\' 5"  (1.651 m)  Wt 180 lb (81.647 kg)  BMI 29.95 kg/m2  SpO2 100%  LMP 06/21/2015 Physical Exam  Musculoskeletal:       Lumbar back: She exhibits tenderness.       Back:  Pain of the right and left lower back with ROM.    ED Course  Procedures (including critical care time) Labs Review Labs Reviewed  URINALYSIS, ROUTINE W REFLEX MICROSCOPIC (NOT AT [facility]) - Abnormal; Notable for the following:    Specific Gravity, Urine >1.030 (*)    All other components within normal limits  PREGNANCY, URINE    Imaging Review No results found. I have personally  reviewed and evaluated these images and lab results as part of my medical decision-making.   EKG Interpretation None      MDM I reviewed previous ED chart and Labs. Vital signs reviewed. No gross neurologic deficits appreciated. No evidence of cauda equina. Urinalysis negative. Urine pregnancy test also negative.  Patient states she has a history of ovarian cysts, which had pain very similar to this particular episode. I have advised the patient to see the GYN for digital evaluation. She did not see the GYN previously when  this episode occurred. I've asked her to return immediately if any changes, problems, or concerns. Prescription for diclofenac and Norco given to the patient.    Final diagnoses:  Low back pain, unspecified back pain laterality, with sciatica presence unspecified  History of ovarian cyst    *I have reviewed nursing notes, vital signs, and all appropriate lab and imaging results for this patient.[address] PA-C 07/20/15 [place], MD 07/21/15 [phone]

## 2015-07-18 NOTE — Discharge Instructions (Signed)
Your vital signs are within normal limits. No gross neurologic deficit appreciated concerning your back pain at this time. It is important that you see a GYN specialist concerning the ovarian cysts issue. The [facility], or the [facility] in [facility] may be of help while you are establishing a primary care physician. Use diclofenac 2 times daily with food. May use Norco for more severe pain. Norco may cause drowsiness, please use with caution. Back Pain, Adult Back pain is very common. The pain often gets better over time. The cause of back pain is usually not dangerous. Most people can learn to manage their back pain on their own.  HOME CARE   Stay active. Start with short walks on flat ground if you can. Try to walk farther each day.  Do not sit, drive, or stand in one place for more than 30 minutes. Do not stay in bed.  Do not avoid exercise or work. Activity can help your back heal faster.  Be careful when you bend or lift an object. Bend at your knees, keep the object close to you, and do not twist.  Sleep on a firm mattress. Lie on your side, and bend your knees. If you lie on your back, put a pillow under your knees.  Only take medicines as told by your doctor.  Put ice on the injured area.  Put ice in a plastic bag.  Place a towel between your skin and the bag.  Leave the ice on for 15-20 minutes, 03-04 times a day for the first 2 to 3 days. After that, you can switch between ice and heat packs.  Ask your doctor about back exercises or massage.  Avoid feeling anxious or stressed. Find good ways to deal with stress, such as exercise. GET HELP RIGHT AWAY IF:   Your pain does not go away with rest or medicine.  Your pain does not go away in 1 week.  You have new problems.  You do not feel well.  The pain spreads into your legs.  You cannot control when you poop (bowel movement) or pee (urinate).  Your arms or legs feel weak or lose feeling  (numbness).  You feel sick to your stomach (nauseous) or throw up (vomit).  You have belly (abdominal) pain.  You feel like you may pass out (faint). MAKE SURE YOU:   Understand these instructions.  Will watch your condition.  Will get help right away if you are not doing well or get worse. Document Released: 04/15/2008 Document Revised: 01/20/2012 Document Reviewed: 03/01/2014 [facility] Patient Information 2015 [place], [place]. This information is not intended to replace advice given to you by your health care provider. Make sure you discuss any questions you have with your health care provider.

## 2015-07-18 NOTE — ED Notes (Signed)
Pt c/o pain in left lower back radiating down left leg and reports pain in left arm.  Pt says had the same pain in back last month and was told she had an ovarian cyst.  Reports pt started yesterday.  Denies abd pain.

## 2015-07-18 NOTE — ED Notes (Signed)
PA at bedside.

## 2015-07-18 NOTE — ED Notes (Signed)
Pt driving, plan to d/c home with stronger pain med.

## 2018-04-15 ENCOUNTER — Ambulatory Visit (HOSPITAL_BASED_OUTPATIENT_CLINIC_OR_DEPARTMENT_OTHER): Payer: 59 | Admitting: Obstetrics & Gynecology

## 2018-06-29 ENCOUNTER — Ambulatory Visit: Payer: 59 | Attending: Obstetrics & Gynecology | Admitting: Obstetrics & Gynecology

## 2018-06-29 ENCOUNTER — Encounter

## 2018-06-29 ENCOUNTER — Ambulatory Visit (HOSPITAL_BASED_OUTPATIENT_CLINIC_OR_DEPARTMENT_OTHER): Payer: 59

## 2018-06-29 ENCOUNTER — Other Ambulatory Visit (HOSPITAL_BASED_OUTPATIENT_CLINIC_OR_DEPARTMENT_OTHER): Payer: Self-pay | Admitting: Obstetrics & Gynecology

## 2018-06-29 VITALS — BP 145/90 | HR 104 | Ht 66.0 in | Wt 180.0 lb

## 2018-06-29 DIAGNOSIS — N946 Dysmenorrhea, unspecified: Secondary | ICD-10-CM | POA: Insufficient documentation

## 2018-06-29 DIAGNOSIS — R102 Pelvic and perineal pain: Secondary | ICD-10-CM | POA: Insufficient documentation

## 2018-06-29 DIAGNOSIS — Z1231 Encounter for screening mammogram for malignant neoplasm of breast: Secondary | ICD-10-CM

## 2018-06-29 DIAGNOSIS — N809 Endometriosis, unspecified: Secondary | ICD-10-CM | POA: Insufficient documentation

## 2018-06-29 DIAGNOSIS — N921 Excessive and frequent menstruation with irregular cycle: Secondary | ICD-10-CM | POA: Insufficient documentation

## 2018-06-29 DIAGNOSIS — Z124 Encounter for screening for malignant neoplasm of cervix: Secondary | ICD-10-CM | POA: Insufficient documentation

## 2018-06-29 DIAGNOSIS — N393 Stress incontinence (female) (male): Secondary | ICD-10-CM | POA: Insufficient documentation

## 2018-06-29 DIAGNOSIS — Z1239 Encounter for other screening for malignant neoplasm of breast: Secondary | ICD-10-CM

## 2018-06-29 LAB — URINALYSIS, MACROSCOPIC
BILIRUBIN: NEGATIVE mg/dL
BLOOD: NEGATIVE mg/dL
COLOR: NORMAL
GLUCOSE: NEGATIVE mg/dL
KETONES: NEGATIVE mg/dL
LEUKOCYTES: NEGATIVE WBCs/uL
NITRITE: NEGATIVE
PH: 7 (ref 5.0–8.0)
PROTEIN: NEGATIVE mg/dL
SPECIFIC GRAVITY: 1.01 (ref 1.005–1.030)
UROBILINOGEN: NEGATIVE mg/dL

## 2018-06-29 LAB — URINALYSIS, MICROSCOPIC
RBCS: 0 /HPF (ref <6.0)
WBCS: 0 /HPF (ref <11.0)
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
## 2018-06-29 NOTE — Patient Instructions (Signed)
Patient will need to have her mammogram and ultrasound at the time of her next visit and labs completed as soon as possible.

## 2018-07-08 LAB — HISTORICAL CYTOPATHOLOGY-GYN (PAP AND HPV TESTS)

## 2018-07-10 LAB — HUMAN PAPILLOMA VIRUS (HPV) BY PCR WITH HIGH RISK GENOTYPING (THINPREP)
HPV OTHER: NEGATIVE
HPV16 PCR: NEGATIVE
HPV18 PCR: NEGATIVE

## 2018-07-25 ENCOUNTER — Encounter (HOSPITAL_BASED_OUTPATIENT_CLINIC_OR_DEPARTMENT_OTHER): Payer: Self-pay | Admitting: Obstetrics & Gynecology

## 2018-09-22 ENCOUNTER — Other Ambulatory Visit (HOSPITAL_BASED_OUTPATIENT_CLINIC_OR_DEPARTMENT_OTHER): Payer: Self-pay

## 2018-09-22 ENCOUNTER — Ambulatory Visit (HOSPITAL_BASED_OUTPATIENT_CLINIC_OR_DEPARTMENT_OTHER): Payer: Self-pay | Admitting: Student in an Organized Health Care Education/Training Program

## 2018-12-02 ENCOUNTER — Ambulatory Visit (HOSPITAL_BASED_OUTPATIENT_CLINIC_OR_DEPARTMENT_OTHER): Payer: Self-pay | Admitting: Obstetrics & Gynecology

## 2019-04-26 ENCOUNTER — Telehealth (HOSPITAL_BASED_OUTPATIENT_CLINIC_OR_DEPARTMENT_OTHER): Payer: Self-pay | Admitting: Obstetrics & Gynecology

## 2019-04-26 NOTE — Telephone Encounter (Signed)
Attempted to speak with someone at [facility] regarding the patients mammogram, ultrasound, and follow up appt. Patient refused follow up 12/02/2018. I need to know if the patient is refusing her mammogram, ultrasound, and follow up or if she would like to let [facility] know that she would like this and they can set up an appt for her. I left a message at [phone]. The line was busy and stated that someone would call back. Jerl Mina, MA  04/26/2019, 14:30

## 2019-05-04 ENCOUNTER — Telehealth (HOSPITAL_BASED_OUTPATIENT_CLINIC_OR_DEPARTMENT_OTHER): Payer: Self-pay | Admitting: Obstetrics & Gynecology

## 2019-05-04 NOTE — Telephone Encounter (Signed)
Ranelle Oyster from [facility] called me back and told me that the patient has refused all appointments, Mammograms, labs, imaging and procedures not only for Dr. Joretta Bachelor, but for all providers that she has seen. Jerl Mina, MA  05/04/2019, 09:58

## 2019-05-04 NOTE — Telephone Encounter (Signed)
Called and left a VM for [facility] to call me back. I left at VM at [phone]. Jerl Mina, MA  05/04/2019, 09:26

## 2019-05-04 NOTE — Telephone Encounter (Signed)
Called [facility] and was transferred to the lieutenants office. She then transferred me to medical and I had to leave a VM there as well. This is the third time I have tried calling this facility. I left callback number multiple times. Jerl Mina, MA  05/04/2019, 09:32

## 2020-03-09 ENCOUNTER — Emergency Department (HOSPITAL_COMMUNITY): Payer: Self-pay

## 2020-03-09 ENCOUNTER — Other Ambulatory Visit: Payer: Self-pay

## 2020-03-09 ENCOUNTER — Encounter (HOSPITAL_COMMUNITY): Payer: Self-pay | Admitting: *Deleted

## 2020-03-09 ENCOUNTER — Emergency Department (HOSPITAL_COMMUNITY)
Admission: EM | Admit: 2020-03-09 | Discharge: 2020-03-09 | Disposition: A | Discharge status: Home / Self Care | Payer: Self-pay | Attending: Emergency Medicine | Admitting: Emergency Medicine

## 2020-03-09 DIAGNOSIS — R102 Pelvic and perineal pain: Secondary | ICD-10-CM | POA: Insufficient documentation

## 2020-03-09 DIAGNOSIS — N83202 Unspecified ovarian cyst, left side: Secondary | ICD-10-CM | POA: Insufficient documentation

## 2020-03-09 DIAGNOSIS — Z79899 Other long term (current) drug therapy: Secondary | ICD-10-CM | POA: Insufficient documentation

## 2020-03-09 HISTORY — DX: Leiomyoma of uterus, unspecified: D25.9

## 2020-03-09 LAB — WET PREP, GENITAL
Clue Cells Wet Prep HPF POC: NONE SEEN
Sperm: NONE SEEN
Trich, Wet Prep: NONE SEEN
Yeast Wet Prep HPF POC: NONE SEEN

## 2020-03-09 LAB — URINALYSIS, ROUTINE W REFLEX MICROSCOPIC
Bilirubin Urine: NEGATIVE
Glucose, UA: NEGATIVE mg/dL
Hgb urine dipstick: NEGATIVE
Ketones, ur: 5 mg/dL — AB
Leukocytes,Ua: NEGATIVE
Nitrite: NEGATIVE
Protein, ur: NEGATIVE mg/dL
Specific Gravity, Urine: 1.017 (ref 1.005–1.030)
pH: 5 (ref 5.0–8.0)

## 2020-03-09 LAB — COMPREHENSIVE METABOLIC PANEL
ALT: 15 U/L (ref 0–44)
AST: 15 U/L (ref 15–41)
Albumin: 4.2 g/dL (ref 3.5–5.0)
Alkaline Phosphatase: 50 U/L (ref 38–126)
Anion gap: 7 (ref 5–15)
BUN: 14 mg/dL (ref 6–20)
CO2: 24 mmol/L (ref 22–32)
Calcium: 9.5 mg/dL (ref 8.9–10.3)
Chloride: 108 mmol/L (ref 98–111)
Creatinine, Ser: 0.63 mg/dL (ref 0.44–1.00)
GFR calc Af Amer: >60 mL/min (ref >60)
GFR calc non Af Amer: >60 mL/min (ref >60)
Glucose, Bld: 102 mg/dL — ABNORMAL HIGH (ref 70–99)
Potassium: 3.5 mmol/L (ref 3.5–5.1)
Sodium: 139 mmol/L (ref 135–145)
Total Bilirubin: 0.5 mg/dL (ref 0.3–1.2)
Total Protein: 6.8 g/dL (ref 6.5–8.1)

## 2020-03-09 LAB — CBC
HCT: 37.2 % (ref 36.0–46.0)
Hemoglobin: 12.4 g/dL (ref 12.0–15.0)
MCH: 30 pg (ref 26.0–34.0)
MCHC: 33.3 g/dL (ref 30.0–36.0)
MCV: 89.9 fL (ref 80.0–100.0)
Platelets: 264 10*3/uL (ref 150–400)
RBC: 4.14 MIL/uL (ref 3.87–5.11)
RDW: 12.1 % (ref 11.5–15.5)
WBC: 7.5 10*3/uL (ref 4.0–10.5)
nRBC: 0 % (ref 0.0–0.2)

## 2020-03-09 LAB — HIV ANTIBODY (ROUTINE TESTING W REFLEX): HIV Screen 4th Generation wRfx: NONREACTIVE

## 2020-03-09 LAB — POC URINE PREG, ED: Preg Test, Ur: NEGATIVE

## 2020-03-09 LAB — LIPASE, BLOOD: Lipase: 33 U/L (ref 11–51)

## 2020-03-09 MED ORDER — HYDROCODONE-ACETAMINOPHEN 5-325 MG PO TABS: 1.0000 | ORAL_TABLET | Freq: Four times a day (QID) | ORAL | 0 refills | Status: AC | PRN

## 2020-03-09 MED ORDER — SODIUM CHLORIDE 0.9% FLUSH: 3.0000 mL | Freq: Once | INTRAVENOUS | Status: DC

## 2020-03-09 MED ORDER — MORPHINE SULFATE (PF) 4 MG/ML IV SOLN
4.0000 mg | Freq: Once | INTRAVENOUS | Status: AC
  Administered 2020-03-09: 18:00:00 4 mg via INTRAVENOUS
  Filled 2020-03-09: qty 1

## 2020-03-09 MED ORDER — KETOROLAC TROMETHAMINE 30 MG/ML IJ SOLN
30.0000 mg | Freq: Once | INTRAMUSCULAR | Status: AC
  Administered 2020-03-09: 30 mg via INTRAVENOUS
  Filled 2020-03-09: qty 1

## 2020-03-09 MED ORDER — MORPHINE SULFATE (PF) 4 MG/ML IV SOLN
4.0000 mg | Freq: Once | INTRAVENOUS | Status: AC
  Administered 2020-03-09: 17:00:00 4 mg via INTRAVENOUS
  Filled 2020-03-09: qty 1

## 2020-03-09 NOTE — Discharge Instructions (Addendum)
Prescription given for Norco. Take medication as directed and do not operate machinery, drive a car, or work while taking this medication as it can make you drowsy.   You have been tested for HIV, syphilis, chlamydia and gonorrhea.  These results will be available in approximately 3 days and you will be contacted by the hospital if the results are positive. Avoid sexual contact until you are aware of the results, and please inform all sexual partners if you test positive for any of these diseases.  Please follow up with your OB-GYN within 5-7 days for re-evaluation of your symptoms.   Please return to the emergency department for any new or worsening symptoms.

## 2020-03-09 NOTE — ED Triage Notes (Signed)
Pt c/o abdominal pain x 6 days and has gotten progressively worse; pt has urinary frequency and oliguria;

## 2020-03-09 NOTE — ED Provider Notes (Signed)
[facility] EMERGENCY DEPARTMENT Provider Note   CSN: [id number] Arrival date & time: 03/09/20  1253     History Chief Complaint  Patient presents with  . Abdominal Pain    Summer Bates is a 44 y.o. female.  HPI   Pt is a 44 y/o female with a h/o endometriosis, fibroids, cholecystectomy, tubal ligation, who presents to the ED today for eval of lower abd pain that radiates to her back. Pain also radiates down her legs. Describes pain as a constant ache. Rates pain 8-10/10.  Denies fevers, nausea, vomiting, diarrhea, constipation. Denies dysuria or hematuria. Reports urinary frequency and urgency.   States she felt like she had a yeast infection due to vaginal itching and discharge prior to the onset of symptoms. She took some of her mothers medication for a yeast infection and that symptoms has seemed to improve since then.  States she had had intercourse with one partner over the last 6 months. They did not use protection. She states she has had the same partner for 6 years and they are monogamous. She denies concern for an STD.   Past Medical History:  Diagnosis Date  . Endometriosis   . Fibroid, uterine     Patient Active Problem List   Diagnosis Date Noted  . Cholecystitis 05/27/2014    Past Surgical History:  Procedure Laterality Date  . CHOLECYSTECTOMY N/A 05/27/2014   Procedure: LAPAROSCOPIC CHOLECYSTECTOMY;  Surgeon: Scherry Ran, MD;  Location: [facility];  Service: General;  Laterality: N/A;  . TONSILLECTOMY    . TUBAL LIGATION       OB History   No obstetric history on file.     History reviewed. No pertinent family history.  Social History   Tobacco Use  . Smoking status: Never Smoker  Substance Use Topics  . Alcohol use: Yes    Alcohol/week: 1.0 standard drinks    Types: 1 Glasses of wine per week    Comment: occ  . Drug use: No    Home Medications Prior to Admission medications   Medication Sig Start Date End Date Taking?  Authorizing Provider  Aspirin-Salicylamide-Caffeine (BC HEADACHE) 325-95-16 MG TABS Take 1-2 packets by mouth daily as needed (for pain).    [provider]  diclofenac (VOLTAREN) 75 MG EC tablet Take 1 tablet (75 mg total) by mouth 2 (two) times daily. Take with food Patient not taking: Reported on 03/09/2020 07/18/15   Lily Kocher, PA-C  HYDROcodone-acetaminophen (NORCO/VICODIN) 5-325 MG tablet Take 1 tablet by mouth every 6 (six) hours as needed. 03/09/20   Cobin Cadavid S, PA-C  naproxen (NAPROSYN) 500 MG tablet Take 1 tablet (500 mg total) by mouth 2 (two) times daily. Patient not taking: Reported on 07/18/2015 06/05/15   Ezequiel Essex, MD  naproxen sodium (ANAPROX) 220 MG tablet Take 220-440 mg by mouth daily as needed (for pain).    [provider]  oxyCODONE-acetaminophen (PERCOCET) 10-325 MG per tablet Take 1 tablet by mouth once.    [provider]  oxyCODONE-acetaminophen (PERCOCET/ROXICET) 5-325 MG per tablet Take 1 tablet by mouth every 4 (four) hours as needed for severe pain. Patient not taking: Reported on 07/18/2015 06/05/15   Ezequiel Essex, MD    Allergies    Patient has no known allergies.  Review of Systems   Review of Systems  Constitutional: Negative for chills and fever.  HENT: Negative for ear pain and sore throat.   Eyes: Negative for visual disturbance.  Respiratory: Negative for cough  and shortness of breath.   Cardiovascular: Negative for chest pain.  Gastrointestinal: Positive for abdominal pain. Negative for constipation, diarrhea, nausea and vomiting.  Genitourinary: Positive for frequency, urgency and vaginal discharge (resolved). Negative for dysuria and hematuria.  Musculoskeletal: Positive for back pain.  Skin: Negative for color change and rash.  Neurological: Negative for headaches.  All other systems reviewed and are negative.   Physical Exam Updated Vital Signs BP 130/78 (BP Location: Right Arm)   Pulse 90   Temp  98.2 F (36.8 C) (Oral)   Resp 18   Ht 5\' 5"  (1.651 m)   Wt 74.8 kg   LMP 02/10/2020   SpO2 100%   BMI 27.46 kg/m   Physical Exam Vitals and nursing note reviewed.  Constitutional:      General: She is not in acute distress.    Appearance: She is well-developed.  HENT:     Head: Normocephalic and atraumatic.  Eyes:     Conjunctiva/sclera: Conjunctivae normal.  Cardiovascular:     Rate and Rhythm: Normal rate and regular rhythm.     Heart sounds: No murmur.  Pulmonary:     Effort: Pulmonary effort is normal. No respiratory distress.     Breath sounds: Normal breath sounds.  Abdominal:     Palpations: Abdomen is soft.     Tenderness: There is abdominal tenderness in the suprapubic area. There is no right CVA tenderness, left CVA tenderness, guarding or rebound.  Genitourinary:    Comments: Exam performed by Rodney Booze,  exam chaperoned Date: 03/09/2020 Pelvic exam: normal external genitalia without evidence of trauma. VULVA: normal appearing vulva with no masses, tenderness or lesion. VAGINA: normal appearing vagina with normal color and discharge, no lesions. CERVIX: normal appearing cervix without lesions, cervical motion tenderness absent, cervical os closed with out purulent discharge; vaginal discharge - white, Wet prep and DNA probe for chlamydia and GC obtained.   ADNEXA: normal adnexa in size, and no masses, TTP noted to the left adnexa UTERUS: uterus is normal size, shape, consistency and is TTP  Musculoskeletal:     Cervical back: Neck supple.  Skin:    General: Skin is warm and dry.  Neurological:     Mental Status: She is alert.     ED Results / Procedures / Treatments   Labs (all labs ordered are listed, but only abnormal results are displayed) Labs Reviewed  WET PREP, GENITAL - Abnormal; Notable for the following components:      Result Value   WBC, Wet Prep HPF POC RARE (*)    All other components within normal limits  COMPREHENSIVE METABOLIC  PANEL - Abnormal; Notable for the following components:   Glucose, Bld 102 (*)    All other components within normal limits  URINALYSIS, ROUTINE W REFLEX MICROSCOPIC - Abnormal; Notable for the following components:   APPearance CLOUDY (*)    Ketones, ur 5 (*)    All other components within normal limits  LIPASE, BLOOD  CBC  HIV ANTIBODY (ROUTINE TESTING W REFLEX)  RPR  POC URINE PREG, ED  GC/CHLAMYDIA PROBE AMP ([place]) NOT AT [facility]    EKG None  Radiology US PELVIS (TRANSABDOMINAL ONLY)  Result Date: 03/09/2020 CLINICAL DATA:  Pelvic pain EXAM: TRANSABDOMINAL ULTRASOUND OF PELVIS DOPPLER ULTRASOUND OF OVARIES TECHNIQUE: Transabdominal ultrasound examination of the pelvis was performed including evaluation of the uterus, ovaries, adnexal regions, and pelvic cul-de-sac. Color and duplex Doppler ultrasound was utilized to evaluate blood flow to the ovaries.  COMPARISON:  Ultrasound 06/05/2015 FINDINGS: Uterus Measurements: 11.4 x 5.3 x 6.3 cm = volume: 192.5 mL. No fibroids or other mass visualized. Endometrium Thickness: 5 mm.  No focal abnormality visualized. Right ovary Measurements: 4.4 x 2.6 x 2.3 cm = volume: 13.7 mL. Normal appearance/no adnexal mass. Left ovary Measurements: 4.2 x 3.3 x 5 cm = volume: 36.3 mL. Hypoechoic complicated cyst in the left ovary measuring 2.8 x 2.3 x 3.2 cm. Pulsed Doppler evaluation demonstrates normal low-resistance arterial and venous waveforms in both ovaries. Other: No significant free fluid IMPRESSION: 1. Negative for ovarian torsion. 2. Mildly complicated left ovarian cyst measuring 3.2 cm, uncertain if this represents same cyst from 2016 or a recurrent cyst. This is probably benign but short interval 6-12 week sonographic follow-up could be performed to assess for stability or resolution. Electronically Signed   By: Donavan Foil M.D.   On: 03/09/2020 17:29   US PELVIC DOPPLER (TORSION R/O OR MASS ARTERIAL FLOW)  Result Date: 03/09/2020 CLINICAL  DATA:  Pelvic pain EXAM: TRANSABDOMINAL ULTRASOUND OF PELVIS DOPPLER ULTRASOUND OF OVARIES TECHNIQUE: Transabdominal ultrasound examination of the pelvis was performed including evaluation of the uterus, ovaries, adnexal regions, and pelvic cul-de-sac. Color and duplex Doppler ultrasound was utilized to evaluate blood flow to the ovaries. COMPARISON:  Ultrasound 06/05/2015 FINDINGS: Uterus Measurements: 11.4 x 5.3 x 6.3 cm = volume: 192.5 mL. No fibroids or other mass visualized. Endometrium Thickness: 5 mm.  No focal abnormality visualized. Right ovary Measurements: 4.4 x 2.6 x 2.3 cm = volume: 13.7 mL. Normal appearance/no adnexal mass. Left ovary Measurements: 4.2 x 3.3 x 5 cm = volume: 36.3 mL. Hypoechoic complicated cyst in the left ovary measuring 2.8 x 2.3 x 3.2 cm. Pulsed Doppler evaluation demonstrates normal low-resistance arterial and venous waveforms in both ovaries. Other: No significant free fluid IMPRESSION: 1. Negative for ovarian torsion. 2. Mildly complicated left ovarian cyst measuring 3.2 cm, uncertain if this represents same cyst from 2016 or a recurrent cyst. This is probably benign but short interval 6-12 week sonographic follow-up could be performed to assess for stability or resolution. Electronically Signed   By: Donavan Foil M.D.   On: 03/09/2020 17:29    Procedures Procedures (including critical care time)  Medications Ordered in ED Medications  sodium chloride flush (NS) 0.9 % injection 3 mL (has no administration in time range)  ketorolac (TORADOL) 30 MG/ML injection 30 mg (30 mg Intravenous Given 03/09/20 1543)  morphine 4 MG/ML injection 4 mg (4 mg Intravenous Given 03/09/20 1630)  morphine 4 MG/ML injection 4 mg (4 mg Intravenous Given 03/09/20 1751)    ED Course  I have reviewed the triage vital signs and the nursing notes.  Pertinent labs & imaging results that were available during my care of the patient were reviewed by me and considered in my medical decision  making (see chart for details).    MDM Rules/Calculators/A&P                      45 year old female with suprapubic abdominal pain, urinary frequency and urgency.  Vital signs reassuring.  Has suprapubic tenderness on exam with no peritoneal signs.  Will get labs, UA, pregnancy test  CBC  without leukocytosis or anemia CMP without gross electrolyte derangement.  Normal kidney and liver function Lipase negative Urine pregnancy is negative UA without leukocytes or nitrites. Urine pregnancy test is negative  Pelvic exam with some mild uterine tenderness and left adnexal tenderness.  Will get ultrasound  Pelvic ultrasound reveals left ovarian cyst without evidence of torsion.  Patient was given several doses of pain medications, IV fluids.  On reassessment she states she feels much improved.  We discussed the results and likely etiology of pain being from the ovarian cyst.  Discussed treatment with Norco.  She was previously on buprenorphine but states that she stopped it about 2 weeks ago.  We discussed alternative pain medications however she does not feel that taking Norco will be a problem for her.  Advised her to follow-up with OB/GYN in regards to her symptoms and return to the ED for new or worsening symptoms.  She voices understanding of plan reasons return.  All Questions answered.  Patient stable for discharge.  Final Clinical Impression(s) / ED Diagnoses Final diagnoses:  Cyst of left ovary    Rx / DC Orders ED Discharge Orders         Ordered    HYDROcodone-acetaminophen (NORCO/VICODIN) 5-325 MG tablet  Every 6 hours PRN     03/09/20 1831           Rodney Booze, PA-C 03/09/20 1834    Elnora Morrison, MD 03/10/20 1544

## 2020-03-10 LAB — GC/CHLAMYDIA PROBE AMP (~~LOC~~) NOT AT ARMC
Chlamydia: NEGATIVE
Comment: NEGATIVE
Comment: NORMAL
Neisseria Gonorrhea: NEGATIVE

## 2020-03-10 LAB — RPR: RPR Ser Ql: NONREACTIVE

## 2020-03-23 ENCOUNTER — Telehealth: Payer: Self-pay | Admitting: Obstetrics and Gynecology

## 2020-03-23 NOTE — Telephone Encounter (Signed)

## 2020-03-24 ENCOUNTER — Encounter: Payer: Self-pay | Admitting: Obstetrics and Gynecology

## 2020-10-19 IMAGING — US US ART/VEN ABD/PELV/SCROTUM DOPPLER LTD
1 series · 13 of 25 positions shown · non-contrast
Comparison: Ultrasound 06/05/2015

CLINICAL DATA: Pelvic pain

EXAM:
TRANSABDOMINAL ULTRASOUND OF PELVIS
DOPPLER ULTRASOUND OF OVARIES
TECHNIQUE: Transabdominal ultrasound examination of the pelvis was performed
including evaluation of the uterus, ovaries, adnexal regions, and
pelvic cul-de-sac.
Color and duplex Doppler ultrasound was utilized to evaluate blood
flow to the ovaries.

[Series 1: us art/ven abd/pelv/scrotum doppler ltd · 60 acquisitions, 13 frames shown]
[im 1/60]
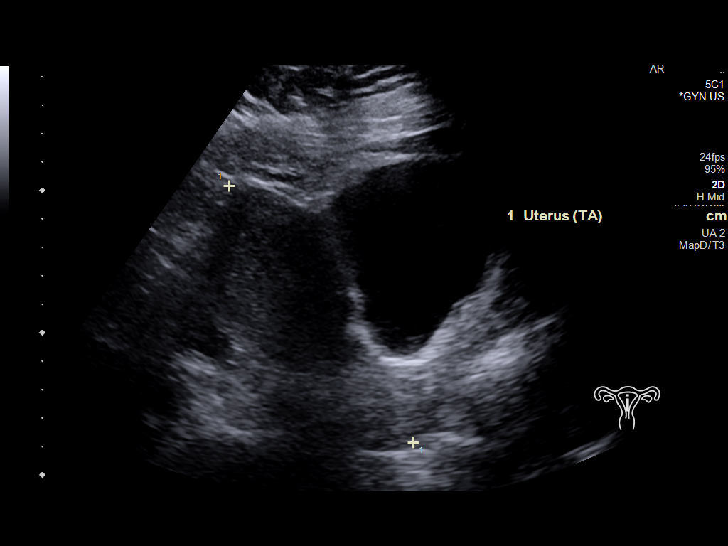
[im 5/60]
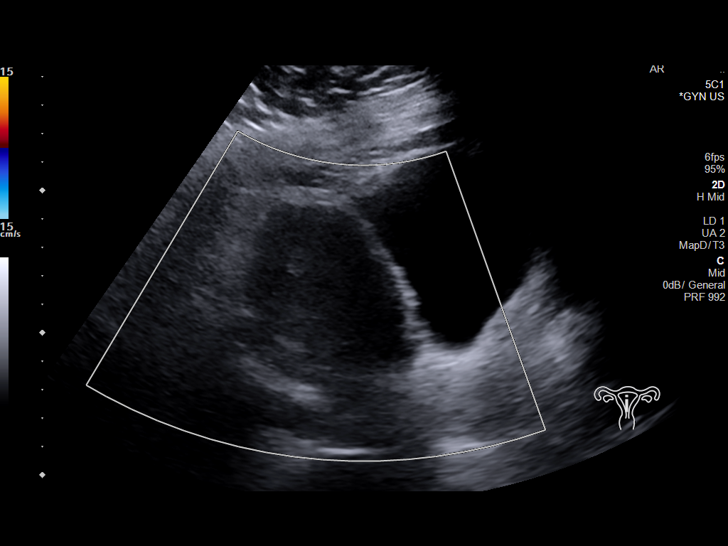
[im 10/60]
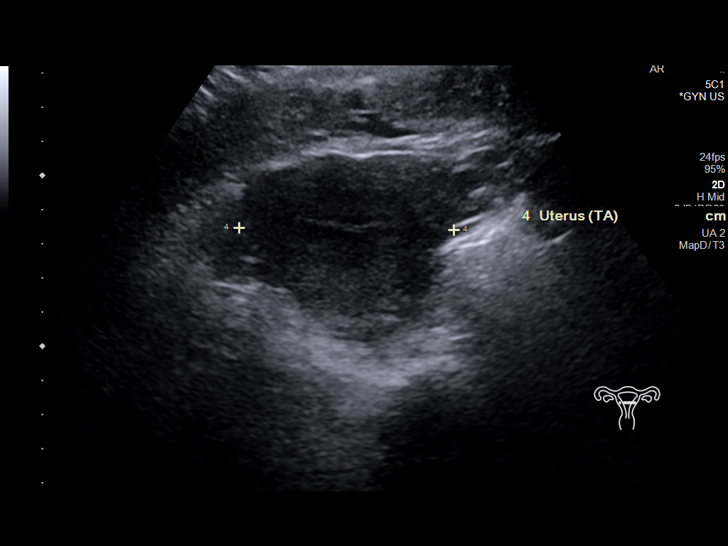
[im 15/60]
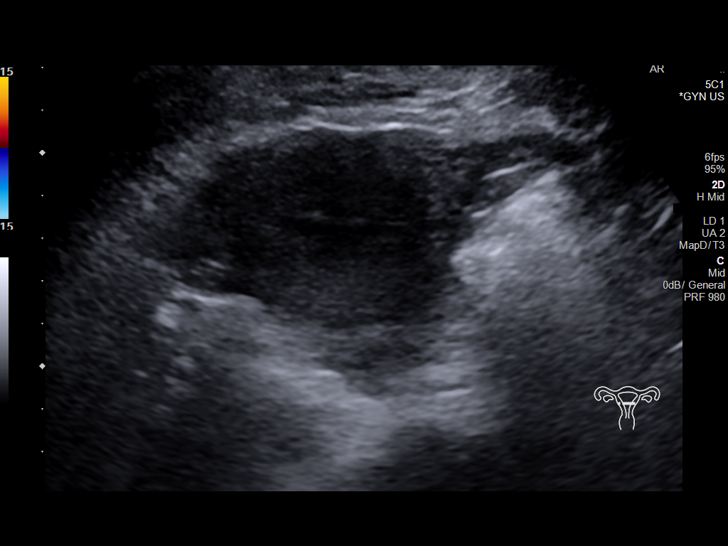
[im 20/60]
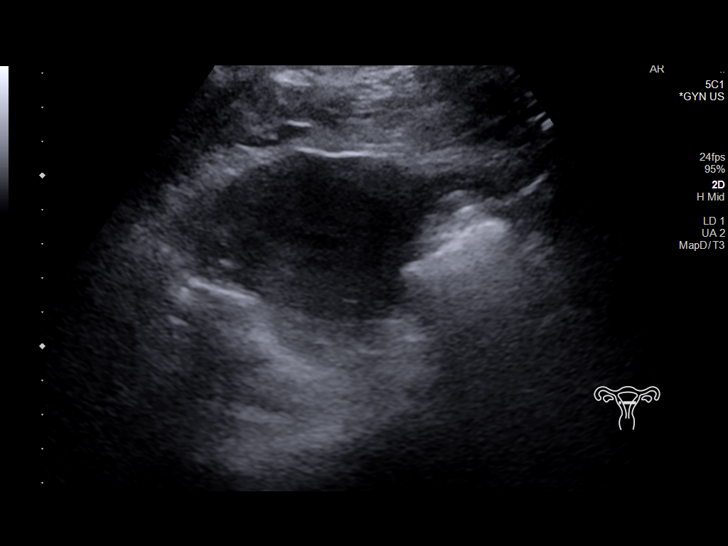
[im 25/60]
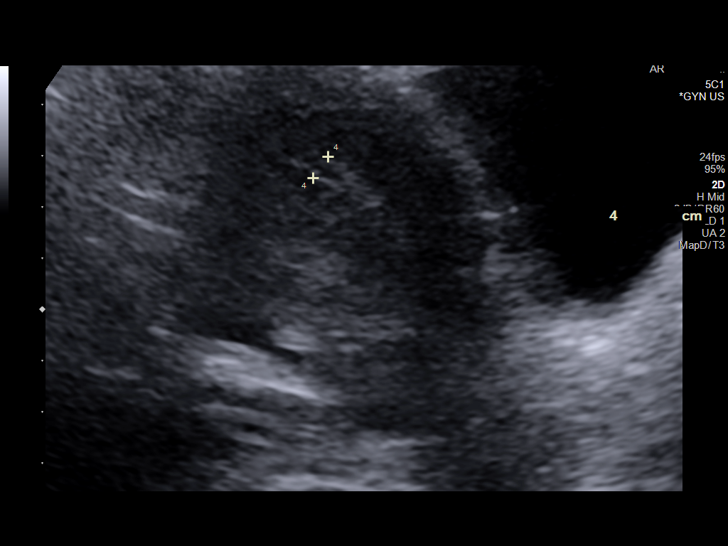
[im 30/60]
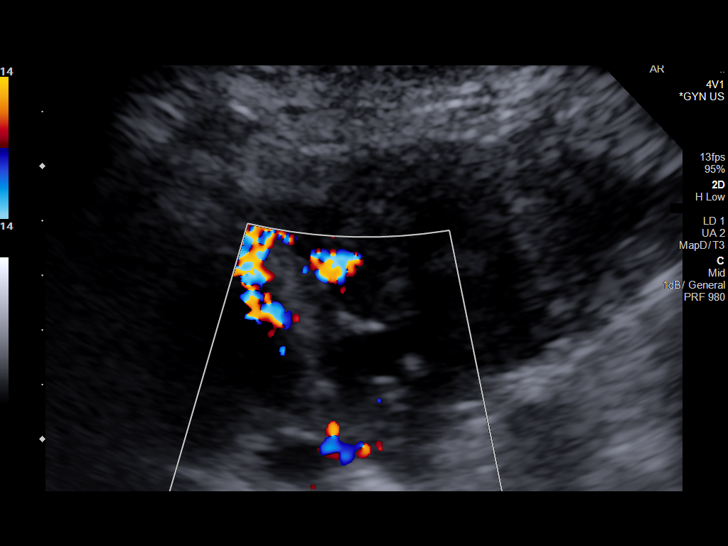
[im 35/60]
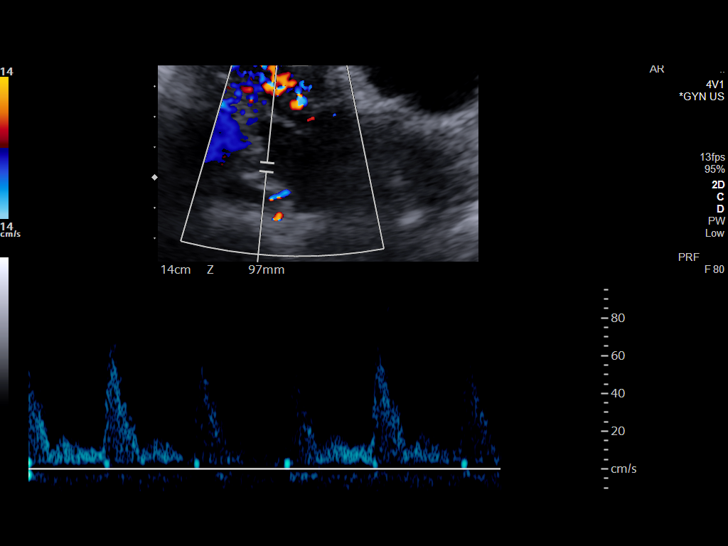
[im 40/60]
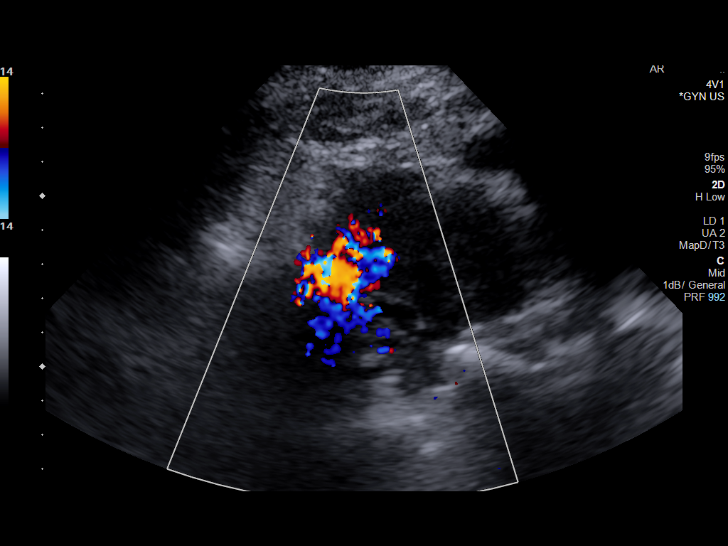
[im 45/60]
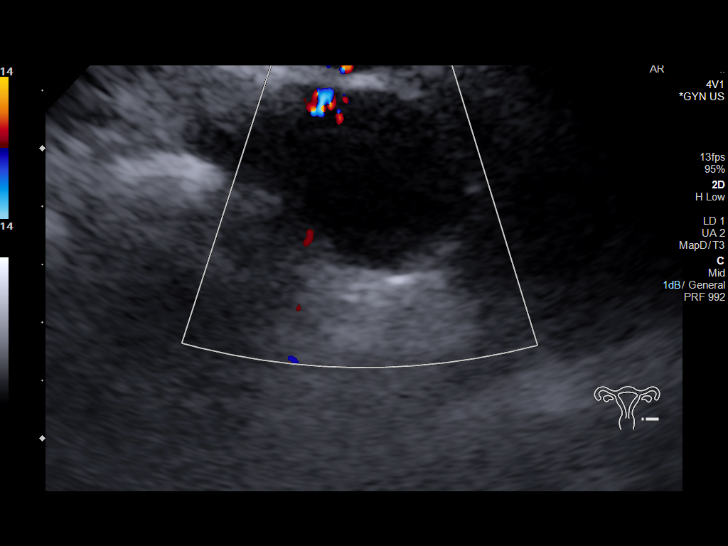
[im 50/60]
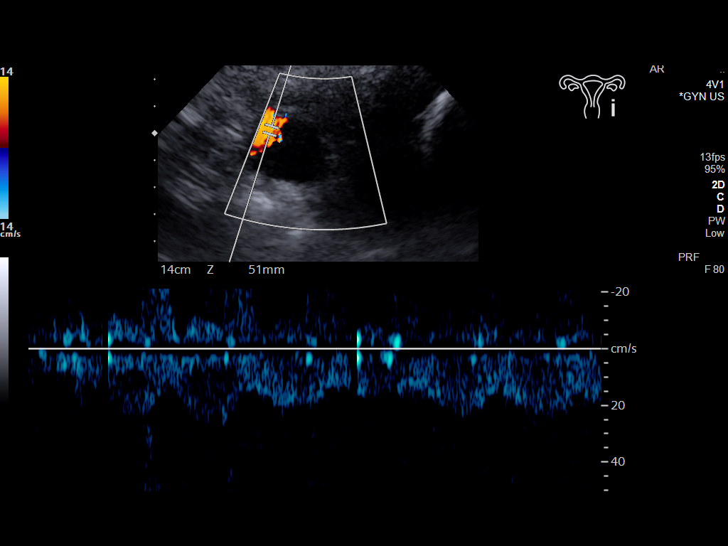
[im 55/60]
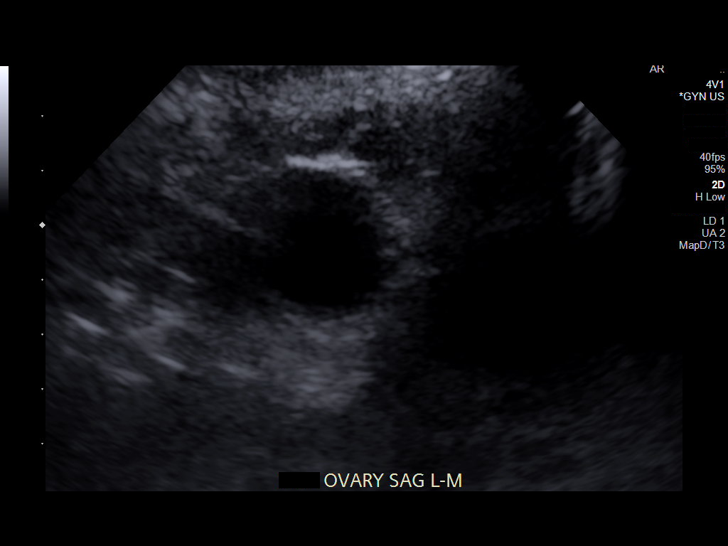
[im 60/60]
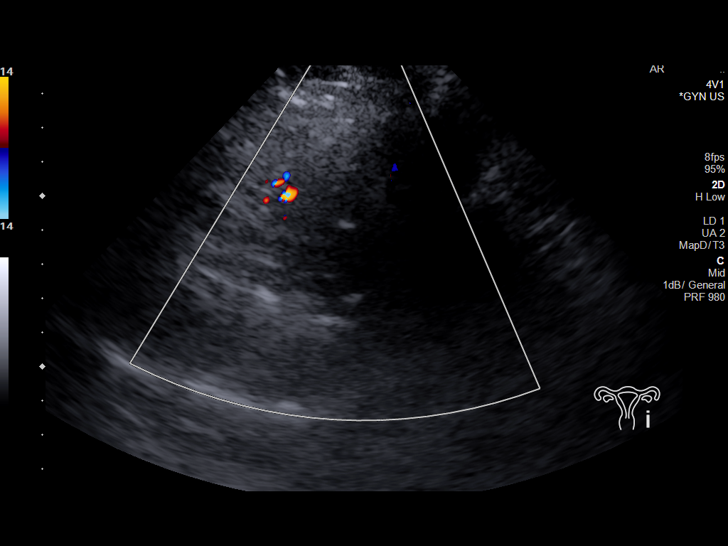

[13 of 25 positions shown; findings below may reference images not displayed]

FINDINGS: Uterus

Measurements: 11.4 x 5.3 x 6.3 cm = volume: 192.5 mL. No fibroids or
other mass visualized.

Endometrium

Thickness: 5 mm.  No focal abnormality visualized.

Right ovary

Measurements: 4.4 x 2.6 x 2.3 cm = volume: 13.7 mL. Normal
appearance/no adnexal mass.

Left ovary

Measurements: 4.2 x 3.3 x 5 cm = volume: 36.3 mL. Hypoechoic
complicated cyst in the left ovary measuring 2.8 x 2.3 x 3.2 cm.

Pulsed Doppler evaluation demonstrates normal low-resistance
arterial and venous waveforms in both ovaries.

Other: No significant free fluid
IMPRESSION: 1. Negative for ovarian torsion.
2. Mildly complicated left ovarian cyst measuring 3.2 cm, uncertain
if this represents same cyst from 1489 or a recurrent cyst. This is
probably benign but short interval 6-12 week sonographic follow-up
could be performed to assess for stability or resolution.

## 2020-10-19 IMAGING — US US PELVIS COMPLETE
1 series · 13 of 25 positions shown · non-contrast
Comparison: Ultrasound 06/05/2015

CLINICAL DATA: Pelvic pain

EXAM:
TRANSABDOMINAL ULTRASOUND OF PELVIS
DOPPLER ULTRASOUND OF OVARIES
TECHNIQUE: Transabdominal ultrasound examination of the pelvis was performed
including evaluation of the uterus, ovaries, adnexal regions, and
pelvic cul-de-sac.
Color and duplex Doppler ultrasound was utilized to evaluate blood
flow to the ovaries.

[Series 1: us pelvis complete · 60 acquisitions, 13 frames shown]
[im 1/60]
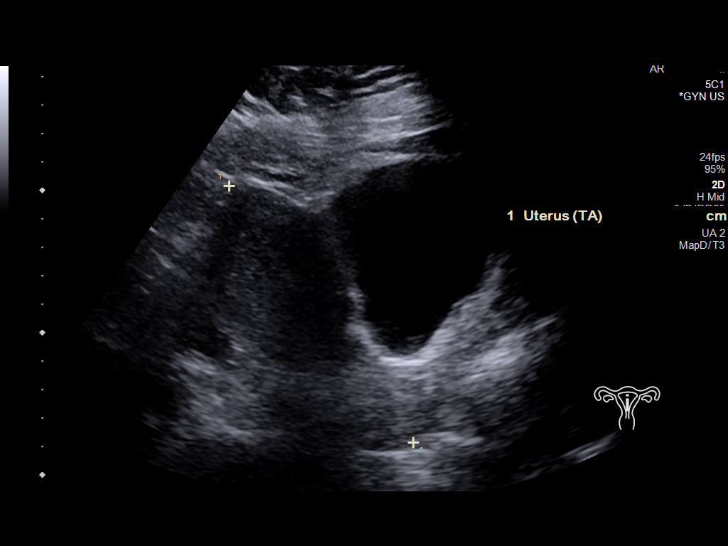
[im 5/60]
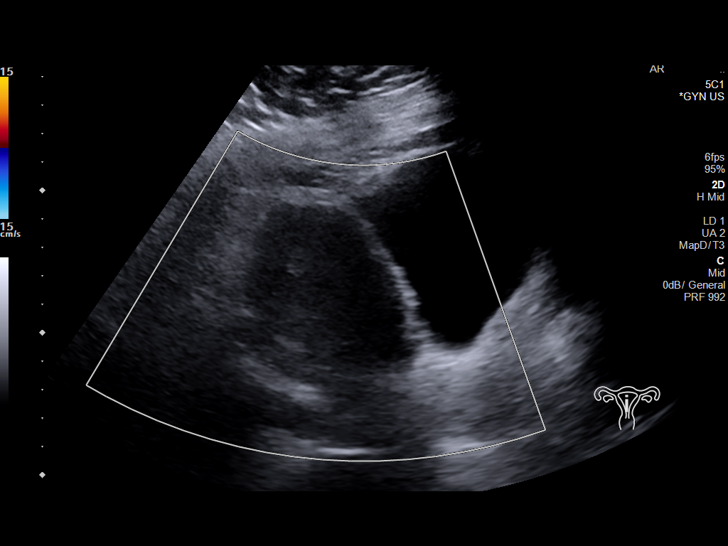
[im 10/60]
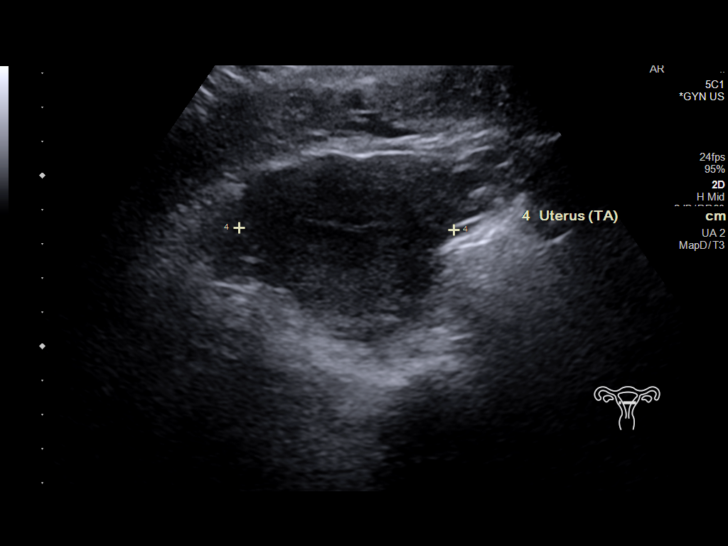
[im 15/60]
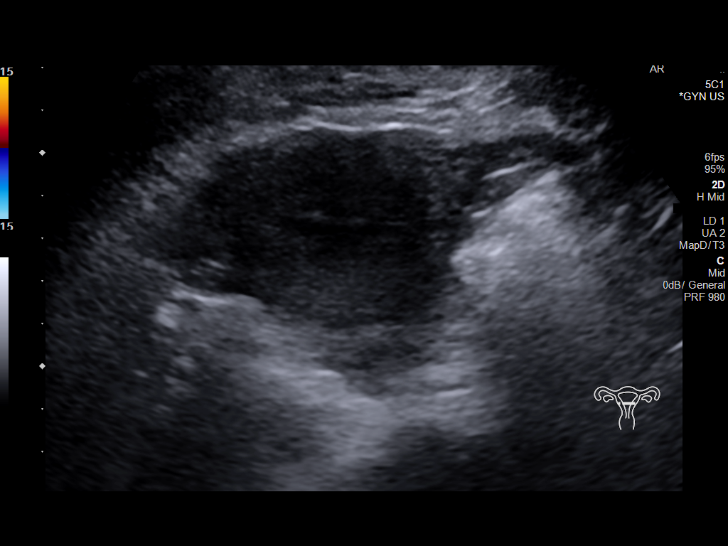
[im 20/60]
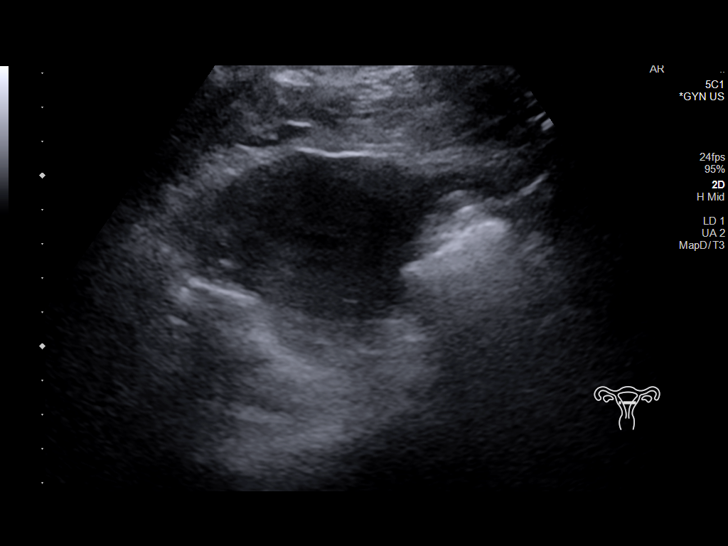
[im 25/60]
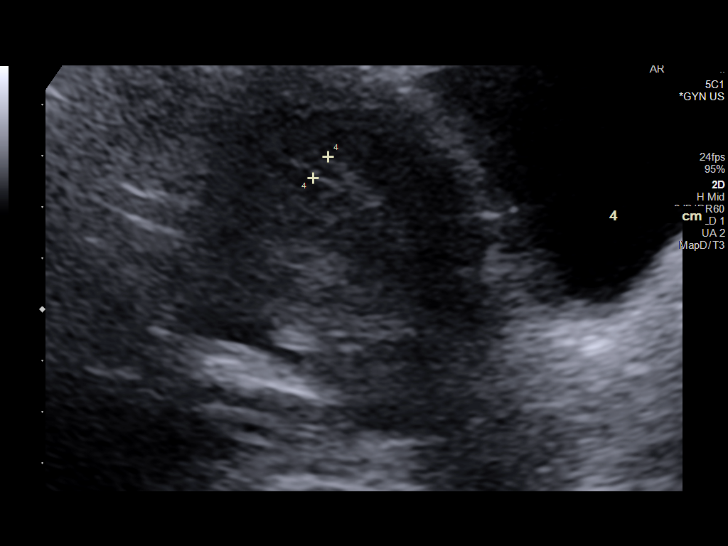
[im 30/60]
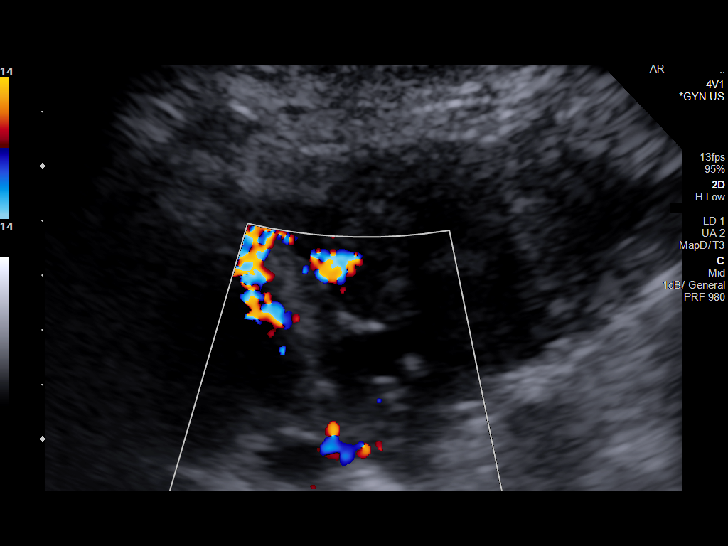
[im 35/60]
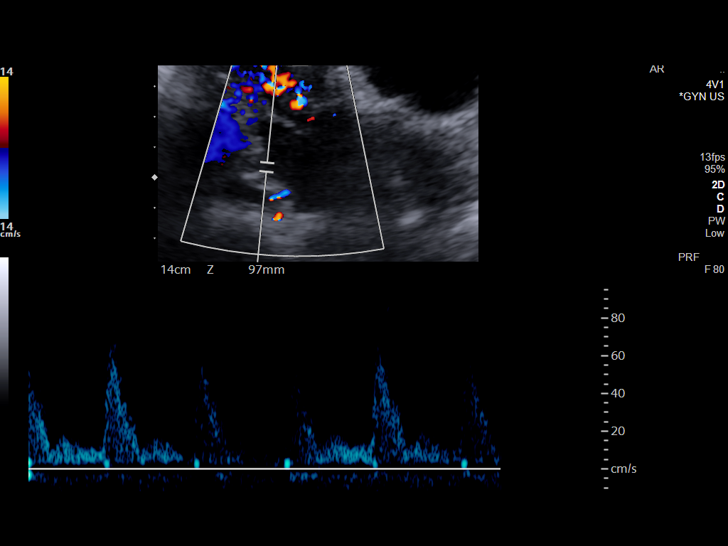
[im 40/60]
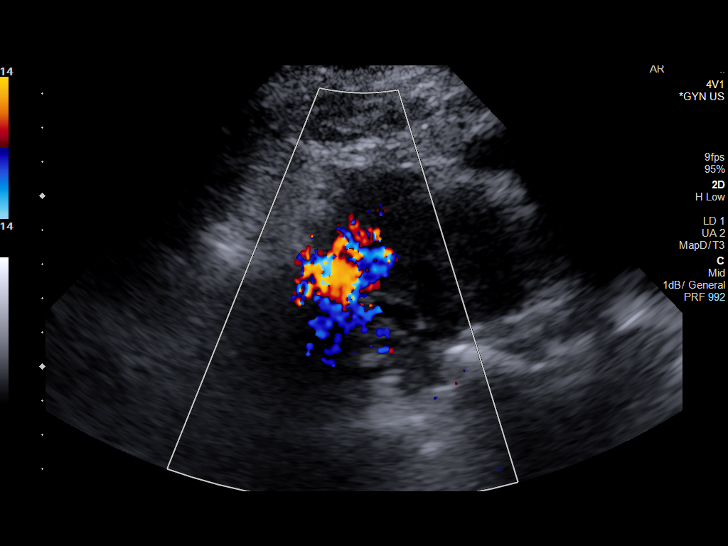
[im 45/60]
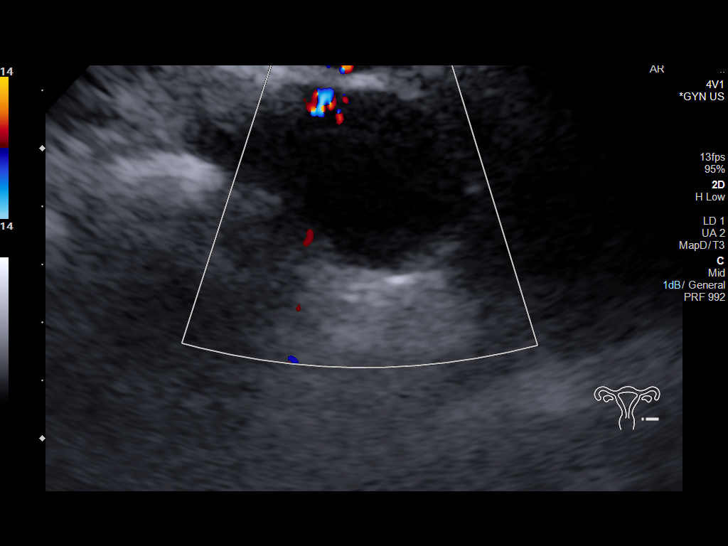
[im 50/60]
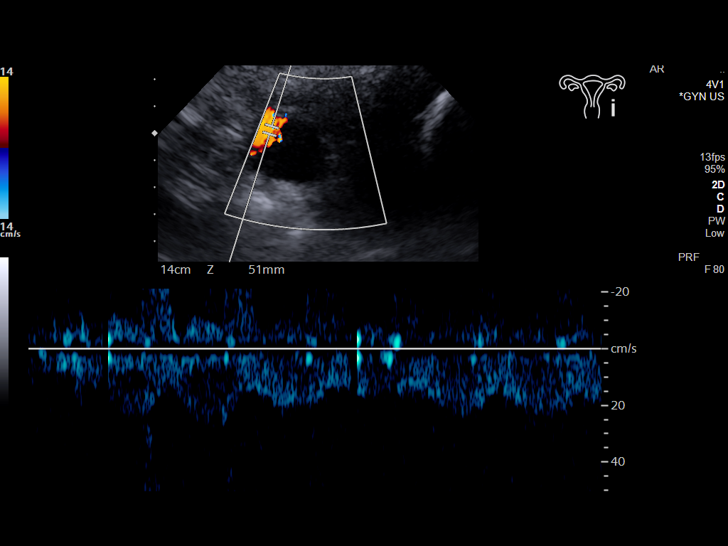
[im 55/60]
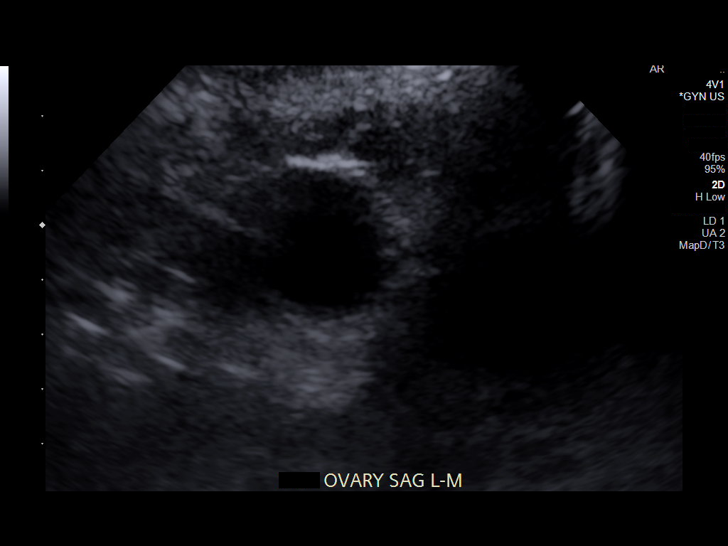
[im 60/60]
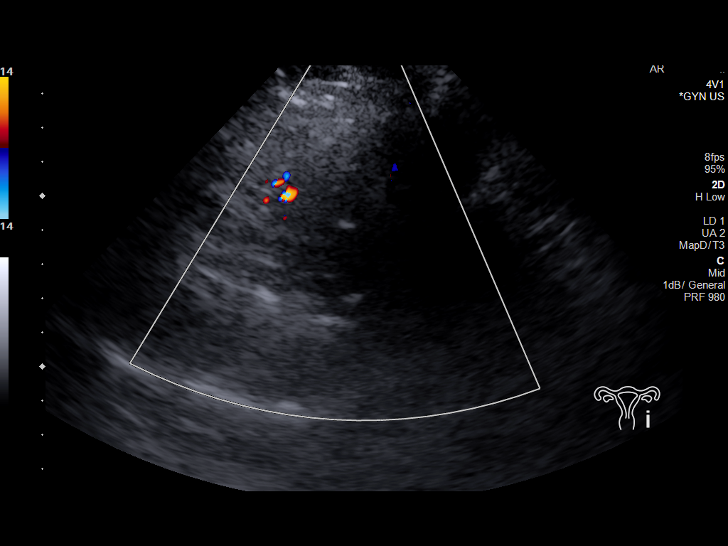

[13 of 25 positions shown; findings below may reference images not displayed]

FINDINGS: Uterus

Measurements: 11.4 x 5.3 x 6.3 cm = volume: 192.5 mL. No fibroids or
other mass visualized.

Endometrium

Thickness: 5 mm.  No focal abnormality visualized.

Right ovary

Measurements: 4.4 x 2.6 x 2.3 cm = volume: 13.7 mL. Normal
appearance/no adnexal mass.

Left ovary

Measurements: 4.2 x 3.3 x 5 cm = volume: 36.3 mL. Hypoechoic
complicated cyst in the left ovary measuring 2.8 x 2.3 x 3.2 cm.

Pulsed Doppler evaluation demonstrates normal low-resistance
arterial and venous waveforms in both ovaries.

Other: No significant free fluid
IMPRESSION: 1. Negative for ovarian torsion.
2. Mildly complicated left ovarian cyst measuring 3.2 cm, uncertain
if this represents same cyst from 1489 or a recurrent cyst. This is
probably benign but short interval 6-12 week sonographic follow-up
could be performed to assess for stability or resolution.
